# Patient Record
Sex: Female | Born: 1960 | Race: White | Hispanic: No | Marital: Married | Smoking: Never smoker
Health system: Southern US, Community
[De-identification: ages and names within clinical notes are randomized; demographics above are authoritative.]

## PROBLEM LIST (undated history)

## (undated) DIAGNOSIS — J45909 Unspecified asthma, uncomplicated: Secondary | ICD-10-CM

## (undated) DIAGNOSIS — E039 Hypothyroidism, unspecified: Secondary | ICD-10-CM

## (undated) DIAGNOSIS — R41840 Attention and concentration deficit: Secondary | ICD-10-CM

## (undated) DIAGNOSIS — K219 Gastro-esophageal reflux disease without esophagitis: Secondary | ICD-10-CM

## (undated) DIAGNOSIS — M1990 Unspecified osteoarthritis, unspecified site: Secondary | ICD-10-CM

## (undated) DIAGNOSIS — M810 Age-related osteoporosis without current pathological fracture: Secondary | ICD-10-CM

---

## 2010-02-17 ENCOUNTER — Emergency Department (HOSPITAL_COMMUNITY): Admission: EM | Admit: 2010-02-17 | Discharge: 2010-02-17 | Payer: Self-pay | Admitting: Emergency Medicine

## 2011-01-07 ENCOUNTER — Inpatient Hospital Stay (INDEPENDENT_AMBULATORY_CARE_PROVIDER_SITE_OTHER)
Admission: RE | Admit: 2011-01-07 | Discharge: 2011-01-07 | Disposition: A | Discharge status: Home / Self Care | Payer: BC Managed Care – PPO | Source: Ambulatory Visit | Attending: Emergency Medicine | Admitting: Emergency Medicine

## 2011-01-07 DIAGNOSIS — G43009 Migraine without aura, not intractable, without status migrainosus: Secondary | ICD-10-CM

## 2011-01-07 LAB — POCT URINALYSIS DIPSTICK
Bilirubin Urine: NEGATIVE
Ketones, ur: NEGATIVE mg/dL
Nitrite: NEGATIVE
Protein, ur: 30 mg/dL — AB
Specific Gravity, Urine: 1.025 (ref 1.005–1.030)
Urine Glucose, Fasting: NEGATIVE mg/dL
Urobilinogen, UA: 0.2 mg/dL (ref 0.0–1.0)
pH: 5.5 (ref 5.0–8.0)

## 2011-01-07 LAB — CBC
HCT: 38.1 % (ref 36.0–46.0)
Hemoglobin: 12.7 g/dL (ref 12.0–15.0)
MCH: 28.5 pg (ref 26.0–34.0)
MCHC: 33.3 g/dL (ref 30.0–36.0)
MCV: 85.6 fL (ref 78.0–100.0)
Platelets: 286 10*3/uL (ref 150–400)
WBC: 6.6 10*3/uL (ref 4.0–10.5)

## 2011-01-07 LAB — POCT I-STAT, CHEM 8
BUN: 6 mg/dL (ref 6–23)
Creatinine, Ser: 0.5 mg/dL (ref 0.4–1.2)
Glucose, Bld: 85 mg/dL (ref 70–99)
Sodium: 139 mEq/L (ref 135–145)
TCO2: 23 mmol/L (ref 0–100)

## 2011-01-07 LAB — POCT PREGNANCY, URINE: Preg Test, Ur: NEGATIVE

## 2011-01-22 ENCOUNTER — Ambulatory Visit: Payer: BC Managed Care – PPO | Attending: Family Medicine

## 2011-01-22 DIAGNOSIS — M25569 Pain in unspecified knee: Secondary | ICD-10-CM | POA: Insufficient documentation

## 2011-01-22 DIAGNOSIS — IMO0001 Reserved for inherently not codable concepts without codable children: Secondary | ICD-10-CM | POA: Insufficient documentation

## 2011-01-22 DIAGNOSIS — R5381 Other malaise: Secondary | ICD-10-CM | POA: Insufficient documentation

## 2011-01-27 ENCOUNTER — Ambulatory Visit: Payer: BC Managed Care – PPO | Admitting: Physical Therapy

## 2011-01-29 ENCOUNTER — Ambulatory Visit: Payer: BC Managed Care – PPO

## 2011-02-03 ENCOUNTER — Ambulatory Visit: Payer: BC Managed Care – PPO

## 2011-02-05 ENCOUNTER — Ambulatory Visit: Payer: BC Managed Care – PPO | Admitting: Physical Therapy

## 2011-02-10 ENCOUNTER — Ambulatory Visit: Payer: BC Managed Care – PPO | Admitting: Physical Therapy

## 2011-02-12 ENCOUNTER — Ambulatory Visit: Payer: BC Managed Care – PPO | Admitting: Physical Therapy

## 2011-02-24 ENCOUNTER — Ambulatory Visit: Payer: BC Managed Care – PPO | Attending: Family Medicine | Admitting: Physical Therapy

## 2011-02-24 DIAGNOSIS — IMO0001 Reserved for inherently not codable concepts without codable children: Secondary | ICD-10-CM | POA: Insufficient documentation

## 2011-02-24 DIAGNOSIS — M25569 Pain in unspecified knee: Secondary | ICD-10-CM | POA: Insufficient documentation

## 2011-02-24 DIAGNOSIS — R5381 Other malaise: Secondary | ICD-10-CM | POA: Insufficient documentation

## 2011-02-26 ENCOUNTER — Ambulatory Visit: Payer: BC Managed Care – PPO | Admitting: Physical Therapy

## 2012-06-05 ENCOUNTER — Emergency Department (INDEPENDENT_AMBULATORY_CARE_PROVIDER_SITE_OTHER)
Admission: EM | Admit: 2012-06-05 | Discharge: 2012-06-05 | Disposition: A | Discharge status: Home / Self Care | Payer: BC Managed Care – PPO | Source: Home / Self Care | Attending: Emergency Medicine | Admitting: Emergency Medicine

## 2012-06-05 ENCOUNTER — Encounter (HOSPITAL_COMMUNITY): Payer: Self-pay

## 2012-06-05 DIAGNOSIS — Z20818 Contact with and (suspected) exposure to other bacterial communicable diseases: Secondary | ICD-10-CM

## 2012-06-05 DIAGNOSIS — Z2089 Contact with and (suspected) exposure to other communicable diseases: Secondary | ICD-10-CM

## 2012-06-05 HISTORY — DX: Attention and concentration deficit: R41.840

## 2012-06-05 HISTORY — DX: Gastro-esophageal reflux disease without esophagitis: K21.9

## 2012-06-05 HISTORY — DX: Unspecified asthma, uncomplicated: J45.909

## 2012-06-05 MED ORDER — AZITHROMYCIN 250 MG PO TABS: ORAL_TABLET | ORAL | Status: AC

## 2012-06-05 NOTE — ED Notes (Signed)
Pts step daughter was at her house on Monday and Tuesday and dxed with pertussis on Thursday and was advised to seek tx.

## 2012-06-05 NOTE — ED Provider Notes (Signed)
Chief Complaint  Patient presents with  . worry well     History of Present Illness:   Andrea Carrillo is a 51 year old female who has been exposed to pertussis. Her stepdaughter stay with her for 2 nights, this past Monday and Tuesday, a week ago. On Thursday, 4 days ago, she was diagnosed as having pertussis. I'm not certain as to her exact symptoms, because Andrea Carrillo said that she wasn't really coughing that badly. Andrea Carrillo herself has not had any symptoms including nasal congestion, rhinorrhea, sore throat, or watery eyes. She denies any coughing, wheezing, or shortness of breath. She does have a history of asthma. She had a Tdap vaccine in 2008. She was advised by a relative, who is a family physician to seek treatment.  Review of Systems:  Other than noted above, the patient denies any of the following symptoms. Systemic:  No fever, chills, sweats, fatigue, myalgias, headache, or anorexia. Eye:  No redness, pain or drainage. ENT:  No earache, ear congestion, nasal congestion, sneezing, rhinorrhea, sinus pressure, sinus pain, post nasal drip, or sore throat. Lungs:  No cough, sputum production, wheezing, shortness of breath, or chest pain. GI:  No abdominal pain, nausea, vomiting, or diarrhea. Skin:  No rash or itching.  PMFSH:  Past medical history, family history, social history, meds, and allergies were reviewed.  Physical Exam:   Vital signs:  BP 198/72  Pulse 72  Temp 98.5 F (36.9 C) (Oral)  Resp 18  SpO2 98%  LMP 05/22/2012 General:  Alert, in no distress. Eye:  No conjunctival injection or drainage. Lids were normal. ENT:  TMs and canals were normal, without erythema or inflammation.  Nasal mucosa was clear and uncongested, without drainage.  Mucous membranes were moist.  Pharynx was clear, without exudate or drainage.  There were no oral ulcerations or lesions. Neck:  Supple, no adenopathy, tenderness or mass. Lungs:  No respiratory distress.  Lungs were clear to auscultation,  without wheezes, rales or rhonchi.  Breath sounds were clear and equal bilaterally. Lungs were resonant to percussion.  No egophony. Heart:  Regular rhythm, without gallops, murmers or rubs. Skin:  Clear, warm, and dry, without rash or lesions.  Assessment:  The encounter diagnosis was Pertussis exposure. She has a household exposure to a culture confirmed case of pertussis, therefore by guidelines, she should get prophylaxis.  Plan:   1.  The following meds were prescribed:   New Prescriptions   AZITHROMYCIN (ZITHROMAX Z-PAK) 250 MG TABLET    Take as directed.   2.  The patient was instructed to be on the lookout for any respiratory symptoms in the next 3 weeks, and report any symptoms to her primary care physician or to come here.  Reuben Likes, MD 06/05/12 1357

## 2013-02-27 ENCOUNTER — Other Ambulatory Visit: Payer: Self-pay | Admitting: Family Medicine

## 2013-03-08 ENCOUNTER — Other Ambulatory Visit: Payer: BC Managed Care – PPO

## 2013-03-13 ENCOUNTER — Other Ambulatory Visit: Payer: Self-pay | Admitting: Family Medicine

## 2013-03-13 DIAGNOSIS — R928 Other abnormal and inconclusive findings on diagnostic imaging of breast: Secondary | ICD-10-CM

## 2013-03-21 ENCOUNTER — Other Ambulatory Visit: Payer: BC Managed Care – PPO

## 2018-03-08 ENCOUNTER — Other Ambulatory Visit: Payer: Self-pay | Admitting: Family Medicine

## 2018-03-08 DIAGNOSIS — Z79899 Other long term (current) drug therapy: Secondary | ICD-10-CM

## 2018-03-09 ENCOUNTER — Ambulatory Visit
Admission: RE | Admit: 2018-03-09 | Discharge: 2018-03-09 | Disposition: A | Discharge status: Home / Self Care | Payer: BLUE CROSS/BLUE SHIELD | Source: Ambulatory Visit | Attending: Family Medicine | Admitting: Family Medicine

## 2018-03-09 ENCOUNTER — Other Ambulatory Visit: Payer: Self-pay

## 2018-03-09 DIAGNOSIS — Z79899 Other long term (current) drug therapy: Secondary | ICD-10-CM

## 2018-06-29 ENCOUNTER — Observation Stay (HOSPITAL_COMMUNITY)
Admission: EM | Admit: 2018-06-29 | Discharge: 2018-07-01 | Disposition: A | Discharge status: Home / Self Care | Payer: BLUE CROSS/BLUE SHIELD | Attending: Internal Medicine | Admitting: Internal Medicine

## 2018-06-29 ENCOUNTER — Emergency Department (HOSPITAL_COMMUNITY): Payer: BLUE CROSS/BLUE SHIELD

## 2018-06-29 ENCOUNTER — Encounter (HOSPITAL_COMMUNITY): Payer: Self-pay | Admitting: Emergency Medicine

## 2018-06-29 ENCOUNTER — Other Ambulatory Visit: Payer: Self-pay

## 2018-06-29 DIAGNOSIS — S52611A Displaced fracture of right ulna styloid process, initial encounter for closed fracture: Secondary | ICD-10-CM | POA: Insufficient documentation

## 2018-06-29 DIAGNOSIS — M25552 Pain in left hip: Secondary | ICD-10-CM | POA: Diagnosis present

## 2018-06-29 DIAGNOSIS — E039 Hypothyroidism, unspecified: Secondary | ICD-10-CM | POA: Diagnosis not present

## 2018-06-29 DIAGNOSIS — K219 Gastro-esophageal reflux disease without esophagitis: Secondary | ICD-10-CM | POA: Insufficient documentation

## 2018-06-29 DIAGNOSIS — W1830XA Fall on same level, unspecified, initial encounter: Secondary | ICD-10-CM | POA: Insufficient documentation

## 2018-06-29 DIAGNOSIS — C50911 Malignant neoplasm of unspecified site of right female breast: Secondary | ICD-10-CM | POA: Diagnosis not present

## 2018-06-29 DIAGNOSIS — M1990 Unspecified osteoarthritis, unspecified site: Secondary | ICD-10-CM | POA: Insufficient documentation

## 2018-06-29 DIAGNOSIS — S52501A Unspecified fracture of the lower end of right radius, initial encounter for closed fracture: Secondary | ICD-10-CM | POA: Diagnosis not present

## 2018-06-29 DIAGNOSIS — E049 Nontoxic goiter, unspecified: Secondary | ICD-10-CM | POA: Diagnosis not present

## 2018-06-29 DIAGNOSIS — R55 Syncope and collapse: Secondary | ICD-10-CM | POA: Diagnosis not present

## 2018-06-29 DIAGNOSIS — C50919 Malignant neoplasm of unspecified site of unspecified female breast: Secondary | ICD-10-CM | POA: Diagnosis present

## 2018-06-29 DIAGNOSIS — Z853 Personal history of malignant neoplasm of breast: Secondary | ICD-10-CM | POA: Diagnosis not present

## 2018-06-29 DIAGNOSIS — W19XXXA Unspecified fall, initial encounter: Secondary | ICD-10-CM

## 2018-06-29 DIAGNOSIS — Y92511 Restaurant or cafe as the place of occurrence of the external cause: Secondary | ICD-10-CM | POA: Insufficient documentation

## 2018-06-29 DIAGNOSIS — M810 Age-related osteoporosis without current pathological fracture: Secondary | ICD-10-CM | POA: Diagnosis present

## 2018-06-29 DIAGNOSIS — J45909 Unspecified asthma, uncomplicated: Secondary | ICD-10-CM | POA: Diagnosis not present

## 2018-06-29 DIAGNOSIS — S52591A Other fractures of lower end of right radius, initial encounter for closed fracture: Secondary | ICD-10-CM | POA: Diagnosis present

## 2018-06-29 HISTORY — DX: Hypothyroidism, unspecified: E03.9

## 2018-06-29 HISTORY — DX: Unspecified osteoarthritis, unspecified site: M19.90

## 2018-06-29 HISTORY — DX: Age-related osteoporosis without current pathological fracture: M81.0

## 2018-06-29 LAB — CBC WITH DIFFERENTIAL/PLATELET
Abs Immature Granulocytes: 0 10*3/uL (ref 0.0–0.1)
Basophils Absolute: 0 10*3/uL (ref 0.0–0.1)
Basophils Relative: 0 %
EOS PCT: 1 %
Eosinophils Absolute: 0.1 10*3/uL (ref 0.0–0.7)
HEMATOCRIT: 43.4 % (ref 36.0–46.0)
HEMOGLOBIN: 13.6 g/dL (ref 12.0–15.0)
Immature Granulocytes: 1 %
LYMPHS ABS: 1.4 10*3/uL (ref 0.7–4.0)
LYMPHS PCT: 19 %
MCH: 28.6 pg (ref 26.0–34.0)
MCHC: 31.3 g/dL (ref 30.0–36.0)
MCV: 91.2 fL (ref 78.0–100.0)
MONO ABS: 0.4 10*3/uL (ref 0.1–1.0)
MONOS PCT: 5 %
Neutro Abs: 5.4 10*3/uL (ref 1.7–7.7)
Neutrophils Relative %: 74 %
Platelets: 215 10*3/uL (ref 150–400)
RBC: 4.76 MIL/uL (ref 3.87–5.11)
RDW: 13.5 % (ref 11.5–15.5)
WBC: 7.4 10*3/uL (ref 4.0–10.5)

## 2018-06-29 LAB — COMPREHENSIVE METABOLIC PANEL
ALK PHOS: 67 U/L (ref 38–126)
ALT: 15 U/L (ref 0–44)
ANION GAP: 10 (ref 5–15)
AST: 21 U/L (ref 15–41)
Albumin: 3.7 g/dL (ref 3.5–5.0)
BILIRUBIN TOTAL: 0.7 mg/dL (ref 0.3–1.2)
BUN: 15 mg/dL (ref 6–20)
CALCIUM: 9.2 mg/dL (ref 8.9–10.3)
CO2: 25 mmol/L (ref 22–32)
Chloride: 106 mmol/L (ref 98–111)
Creatinine, Ser: 0.69 mg/dL (ref 0.44–1.00)
GFR calc Af Amer: >60 mL/min (ref >60)
GFR calc non Af Amer: >60 mL/min (ref >60)
Glucose, Bld: 153 mg/dL — ABNORMAL HIGH (ref 70–99)
Potassium: 4.4 mmol/L (ref 3.5–5.1)
Sodium: 141 mmol/L (ref 135–145)
TOTAL PROTEIN: 6.5 g/dL (ref 6.5–8.1)

## 2018-06-29 LAB — I-STAT TROPONIN, ED: Troponin i, poc: 0 ng/mL (ref 0.00–0.08)

## 2018-06-29 LAB — I-STAT BETA HCG BLOOD, ED (MC, WL, AP ONLY)

## 2018-06-29 LAB — TROPONIN I: Troponin I: <0.03 ng/mL (ref <0.03)

## 2018-06-29 LAB — T4, FREE: Free T4: 0.67 ng/dL — ABNORMAL LOW (ref 0.82–1.77)

## 2018-06-29 LAB — TSH: TSH: 1.116 u[IU]/mL (ref 0.350–4.500)

## 2018-06-29 MED ORDER — ONDANSETRON HCL 4 MG/2ML IJ SOLN
4.0000 mg | Freq: Once | INTRAMUSCULAR | Status: AC
  Administered 2018-06-29: 4 mg via INTRAVENOUS
  Filled 2018-06-29: qty 2

## 2018-06-29 MED ORDER — ONDANSETRON HCL 4 MG PO TABS: 4.0000 mg | ORAL_TABLET | Freq: Four times a day (QID) | ORAL | Status: DC | PRN

## 2018-06-29 MED ORDER — IOPAMIDOL (ISOVUE-370) INJECTION 76%
INTRAVENOUS | Status: AC
  Filled 2018-06-29: qty 100

## 2018-06-29 MED ORDER — HYDROMORPHONE HCL 1 MG/ML IJ SOLN
1.0000 mg | Freq: Once | INTRAMUSCULAR | Status: AC
  Administered 2018-06-29: 1 mg via INTRAVENOUS
  Filled 2018-06-29: qty 1

## 2018-06-29 MED ORDER — OXYCODONE HCL 5 MG PO TABS
5.0000 mg | ORAL_TABLET | ORAL | Status: DC | PRN
  Administered 2018-06-30 – 2018-07-01 (×7): 5 mg via ORAL
  Filled 2018-06-29 (×7): qty 1

## 2018-06-29 MED ORDER — IOPAMIDOL (ISOVUE-370) INJECTION 76%
100.0000 mL | Freq: Once | INTRAVENOUS | Status: AC | PRN
  Administered 2018-06-29: 100 mL via INTRAVENOUS

## 2018-06-29 MED ORDER — SODIUM CHLORIDE 0.9% FLUSH
3.0000 mL | Freq: Two times a day (BID) | INTRAVENOUS | Status: DC
  Administered 2018-06-30: 3 mL via INTRAVENOUS

## 2018-06-29 MED ORDER — ACETAMINOPHEN 650 MG RE SUPP: 650.0000 mg | Freq: Four times a day (QID) | RECTAL | Status: DC | PRN

## 2018-06-29 MED ORDER — ENOXAPARIN SODIUM 40 MG/0.4ML ~~LOC~~ SOLN
40.0000 mg | SUBCUTANEOUS | Status: DC
  Administered 2018-06-30 – 2018-07-01 (×2): 40 mg via SUBCUTANEOUS
  Filled 2018-06-29 (×2): qty 0.4

## 2018-06-29 MED ORDER — POLYETHYLENE GLYCOL 3350 17 G PO PACK: 17.0000 g | PACK | Freq: Every day | ORAL | Status: DC | PRN

## 2018-06-29 MED ORDER — SODIUM CHLORIDE 0.9 % IV SOLN
100.0000 mL/h | INTRAVENOUS | Status: DC
  Administered 2018-06-30: 100 mL/h via INTRAVENOUS

## 2018-06-29 MED ORDER — SODIUM CHLORIDE 0.9 % IV BOLUS
1000.0000 mL | Freq: Once | INTRAVENOUS | Status: AC
  Administered 2018-06-29: 1000 mL via INTRAVENOUS

## 2018-06-29 MED ORDER — PROMETHAZINE HCL 25 MG/ML IJ SOLN
25.0000 mg | Freq: Once | INTRAMUSCULAR | Status: AC
  Administered 2018-06-29: 25 mg via INTRAVENOUS
  Filled 2018-06-29: qty 1

## 2018-06-29 MED ORDER — ONDANSETRON HCL 4 MG/2ML IJ SOLN
4.0000 mg | Freq: Four times a day (QID) | INTRAMUSCULAR | Status: DC | PRN
  Administered 2018-06-30: 4 mg via INTRAVENOUS
  Filled 2018-06-29: qty 2

## 2018-06-29 MED ORDER — MORPHINE SULFATE (PF) 2 MG/ML IV SOLN
2.0000 mg | INTRAVENOUS | Status: DC | PRN
  Administered 2018-06-30 (×3): 2 mg via INTRAVENOUS
  Filled 2018-06-29 (×3): qty 1

## 2018-06-29 MED ORDER — ACETAMINOPHEN 325 MG PO TABS
650.0000 mg | ORAL_TABLET | Freq: Four times a day (QID) | ORAL | Status: DC | PRN
  Administered 2018-06-30 – 2018-07-01 (×3): 650 mg via ORAL
  Filled 2018-06-29 (×3): qty 2

## 2018-06-29 NOTE — Progress Notes (Signed)
Orthopedic Tech Progress Note Patient Details:  Andrea Carrillo 1961-10-04 [id number]  Ortho Devices Type of Ortho Device: Velcro wrist splint, Sling immobilizer Ortho Device/Splint Location: rue Ortho Device/Splint Interventions: Ordered, Application, Adjustment   Post Interventions Patient Tolerated: Well Instructions Provided: Care of device, Adjustment of device Applied velcro wrist at drs request.  Karolee Stamps 06/29/2018, 9:33 PM

## 2018-06-29 NOTE — ED Provider Notes (Signed)
[facility] EMERGENCY DEPARTMENT Provider Note   CSN: [id number] Arrival date & time: 06/29/18  1740     History   Chief Complaint Chief Complaint  Patient presents with  . Loss of Consciousness    HPI Carnita Rouillard is a 57 y.o. female hx of osteoporosis, previous breast cancer in remission, here presenting with possible syncope, fall.  Patient states that she was at [employer] and was bending down and did not quite remember what happened.  She states that she may have passed out and woke up on the floor with severe right wrist pain.  She also had some left-sided rib pain and abdominal pain as well.  EMS noticed that she was in severe pain she was given fentanyl 100 mcg prior to arrival.  She also was noted to have right wrist swelling and was put in a splint and a towel was placed for c-collar.   The history is provided by the patient.    Past Medical History:  Diagnosis Date  . Acid reflux   . Arthritis   . Asthma   . Attention deficit   . Osteoporosis     There are no active problems to display for this patient.   History reviewed. No pertinent surgical history.   OB History   None      Home Medications    Prior to Admission medications   Medication Sig Start Date End Date Taking? Authorizing Provider  letrozole (FEMARA) 2.5 MG tablet Take 2.5 mg by mouth every evening.  11/04/17  Yes [provider]  levothyroxine (SYNTHROID, LEVOTHROID) 25 MCG tablet Take 25 mcg by mouth every evening.    Yes [provider]  omeprazole (PRILOSEC) 20 MG capsule Take 20 mg by mouth every evening. 05/03/14  Yes [provider]    Family History No family history on file.  Social History Social History   Tobacco Use  . Smoking status: Never Smoker  Substance Use Topics  . Alcohol use: No  . Drug use: No     Allergies   Latex   Review of Systems Review of Systems  Cardiovascular: Positive for syncope.    Gastrointestinal: Positive for abdominal pain.  Musculoskeletal:       R wrist pain, L rib pain   All other systems reviewed and are negative.    Physical Exam Updated Vital Signs BP 127/80   Pulse (!) 57   Temp 97.9 F (36.6 C) (Oral)   Resp 16   LMP 06/21/2018 Comment: Hysterectomy  SpO2 95%   Physical Exam  Constitutional: She is oriented to person, place, and time.  Uncomfortable   HENT:  Head: Normocephalic.  ? Bruising R frontal area   Eyes: Pupils are equal, round, and reactive to light. Conjunctivae and EOM are normal.  Neck: Normal range of motion. Neck supple.  Cardiovascular: Normal rate, regular rhythm and normal heart sounds.  Pulmonary/Chest: Effort normal and breath sounds normal. No stridor. No respiratory distress.  + L lower rib tenderness   Abdominal: Soft. Bowel sounds are normal.  Mild tenderness LUQ   Musculoskeletal:  Obvious deformity R distal forearm. No humerus or shoulder tenderness or deformity. No midline spinal tenderness, nl ROM bilateral hips   Neurological: She is alert and oriented to person, place, and time. No cranial nerve deficit. Coordination normal.  Skin: Skin is warm.  Psychiatric: She has a normal mood and affect.  Nursing note and vitals reviewed.    ED Treatments /  Results  Labs (all labs ordered are listed, but only abnormal results are displayed) Labs Reviewed  COMPREHENSIVE METABOLIC PANEL - Abnormal; Notable for the following components:      Result Value   Glucose, Bld 153 (*)    All other components within normal limits  CBC WITH DIFFERENTIAL/PLATELET  I-STAT TROPONIN, ED  I-STAT BETA HCG BLOOD, ED ([facility], [facility], [facility] ONLY)    EKG EKG Interpretation  Date/Time:  Wednesday June 29 2018 17:58:11 EDT Ventricular Rate:  51 PR Interval:    QRS Duration: 101 QT Interval:  440 QTC Calculation: 406 R Axis:   48 Text Interpretation:  Sinus rhythm Abnormal R-wave progression, early transition Borderline ST elevation,  lateral leads No previous ECGs available Confirmed by Wandra Arthurs ([id number]) on 06/29/2018 6:12:41 PM   Radiology Dg Chest 2 View  Result Date: 06/29/2018 CLINICAL DATA:  Syncopal episode resulting in a fall today. EXAM: CHEST - 2 VIEW COMPARISON:  None. FINDINGS: Normal sized heart. Clear lungs. Mild central peribronchial thickening. Bilateral axillary surgical clips. No fracture or pneumothorax. Normal vascularity. IMPRESSION: No acute abnormality.  Mild chronic bronchitic changes. Electronically Signed   By: Claudie Revering M.D.   On: 06/29/2018 19:47   Dg Forearm Right  Result Date: 06/29/2018 CLINICAL DATA:  Distal right forearm and wrist pain following a fall today. EXAM: RIGHT FOREARM - 2 VIEW COMPARISON:  None. FINDINGS: Mildly comminuted, impacted fracture of the distal radial metaphysis with a suggestion of mild dorsal displacement and angulation of the distal fragment on an oblique lateral view. There is no true lateral view available for accurate assessment. Also demonstrated is a nondisplaced fracture through the base of the ulnar styloid. IMPRESSION: Distal radius and ulnar styloid fractures, as described above. A true lateral view of the wrist would be necessary to accurately assess alignment. Electronically Signed   By: Claudie Revering M.D.   On: 06/29/2018 19:45   Ct Head Wo Contrast  Result Date: 06/29/2018 CLINICAL DATA:  57 year old female with a history of syncope EXAM: CT HEAD WITHOUT CONTRAST CT CERVICAL SPINE WITHOUT CONTRAST TECHNIQUE: Multidetector CT imaging of the head and cervical spine was performed following the standard protocol without intravenous contrast. Multiplanar CT image reconstructions of the cervical spine were also generated. COMPARISON:  None. FINDINGS: CT HEAD FINDINGS Brain: No acute intracranial hemorrhage. No midline shift or mass effect. Gray-white differentiation maintained. Unremarkable appearance of the ventricular system. Vascular: Unremarkable. Skull: No acute  fracture.  No aggressive bone lesion identified. Sinuses/Orbits: Unremarkable appearance of the orbits. Mastoid air cells clear. No middle ear effusion. No significant sinus disease. Other: None CT CERVICAL SPINE FINDINGS Alignment: Craniocervical junction aligned. Anatomic alignment of the cervical elements, with straightening of the normal cervical lordosis, likely positional. No subluxation. Skull base and vertebrae: No acute fracture at the skullbase. Vertebral body heights relatively maintained. No acute fracture identified. Soft tissues and spinal canal: Unremarkable cervical soft tissues. Lymph nodes are present, though not enlarged. Disc levels: Disc space narrowing with early endplate changes. No significant canal narrowing or significant foraminal narrowing. Upper chest: Unremarkable appearance of the lung apices. Enlargement of the left thyroid. Other: No bony canal narrowing. IMPRESSION: Head CT: Negative for acute intracranial abnormality. Cervical CT: No acute fracture or malalignment of the cervical spine. Left thyroid goiter.  Endocrine evaluation may be warranted. Electronically Signed   By: Corrie Mckusick D.O.   On: 06/29/2018 20:17   Ct Angio Chest Pe W And/or Wo Contrast  Result Date: 06/29/2018 CLINICAL  DATA:  57 year old female with a history of syncope. Left-sided chest pain EXAM: CT ANGIOGRAPHY CHEST CT ABDOMEN AND PELVIS WITH CONTRAST TECHNIQUE: Multidetector CT imaging of the chest was performed using the standard protocol during bolus administration of intravenous contrast. Multiplanar CT image reconstructions and MIPs were obtained to evaluate the vascular anatomy. Multidetector CT imaging of the abdomen and pelvis was performed using the standard protocol during bolus administration of intravenous contrast. CONTRAST:  147mL ISOVUE-370 IOPAMIDOL (ISOVUE-370) INJECTION 76% COMPARISON:  None. FINDINGS: CTA CHEST FINDINGS Cardiovascular: Heart: No cardiomegaly. No pericardial  fluid/thickening. No significant coronary calcifications. Aorta: Unremarkable course, caliber, contour of the thoracic aorta. No aneurysm or dissection flap. No periaortic fluid. Pulmonary arteries: No central, lobar, segmental, or proximal subsegmental filling defects. Mediastinum/Nodes: Rightward deviation of the trachea secondary to enlargement of the left thyroid. Greatest diameter of the left thyroid tissue measures approximately 4.4 cm on the axial images. Unremarkable course of the thoracic esophagus. No lymphadenopathy. Lungs/Pleura: Central airways are clear. No pleural effusion. No confluent airspace disease. No pneumothorax. Musculoskeletal: No acute displaced fracture. Degenerative changes of the thoracic spine. Review of the MIP images confirms the above findings. CT ABDOMEN and PELVIS FINDINGS Hepatobiliary: There are a few small hypodense lesions of liver parenchyma. The largest measures 17 mm in segment 6. Overall, features are most compatible with benign cystic lesions. Calcified cholelithiasis without associated inflammation. Pancreas: Unremarkable pancreas Spleen: Unremarkable spleen Adrenals/Urinary Tract: Unremarkable appearance of the adrenal glands. No evidence of hydronephrosis of the right or left kidney. No nephrolithiasis. Unremarkable course of the bilateral ureters. Unremarkable appearance of the urinary bladder. Stomach/Bowel: Surgical changes of the stomach. Small bowel unremarkable with no abnormal distention. No transition point. Laxity of the ventral abdominal wall, with focal hernia on the patient's right lower abdominal wall containing small bowel loops. No transition point. There is a smaller hernia sac in the supraumbilical region containing transverse colon with no transition point. Normal appendix. Colonic diverticula without associated inflammation. Vascular/Lymphatic: No significant atherosclerosis. Mesenteric, renal, bilateral lower extremity arteries are patent. No  abdominal lymphadenopathy. No retroperitoneal or pelvic lymphadenopathy. Reproductive: Hysterectomy Other: Additional hernia of the left lower abdominal wall containing fat. Musculoskeletal: No displaced fracture. Degenerative changes of the thoracolumbar spine. Degenerative changes of the hips. Review of the MIP images confirms the above findings. IMPRESSION: No acute CT finding of the chest, abdomen, pelvis. Left thyroid goiter.  Endocrine evaluation may be considered. Cholelithiasis without evidence of acute cholecystitis. Low-density, nonenhancing lesions of the liver, most likely benign biliary cysts. Surgical changes of the stomach. There are 3 ventral wall hernias. Supraumbilical hernia contains transverse colon without obstruction. Right lower abdominal wall hernia contains small bowel loops, without obstruction. Left lower abdominal hernia contains mesenteric fat. Diverticular disease without evidence of acute diverticulitis. Electronically Signed   By: Corrie Mckusick D.O.   On: 06/29/2018 20:28   Ct Cervical Spine Wo Contrast  Result Date: 06/29/2018 CLINICAL DATA:  57 year old female with a history of syncope EXAM: CT HEAD WITHOUT CONTRAST CT CERVICAL SPINE WITHOUT CONTRAST TECHNIQUE: Multidetector CT imaging of the head and cervical spine was performed following the standard protocol without intravenous contrast. Multiplanar CT image reconstructions of the cervical spine were also generated. COMPARISON:  None. FINDINGS: CT HEAD FINDINGS Brain: No acute intracranial hemorrhage. No midline shift or mass effect. Gray-white differentiation maintained. Unremarkable appearance of the ventricular system. Vascular: Unremarkable. Skull: No acute fracture.  No aggressive bone lesion identified. Sinuses/Orbits: Unremarkable appearance of the orbits. Mastoid air cells clear. No  middle ear effusion. No significant sinus disease. Other: None CT CERVICAL SPINE FINDINGS Alignment: Craniocervical junction aligned.  Anatomic alignment of the cervical elements, with straightening of the normal cervical lordosis, likely positional. No subluxation. Skull base and vertebrae: No acute fracture at the skullbase. Vertebral body heights relatively maintained. No acute fracture identified. Soft tissues and spinal canal: Unremarkable cervical soft tissues. Lymph nodes are present, though not enlarged. Disc levels: Disc space narrowing with early endplate changes. No significant canal narrowing or significant foraminal narrowing. Upper chest: Unremarkable appearance of the lung apices. Enlargement of the left thyroid. Other: No bony canal narrowing. IMPRESSION: Head CT: Negative for acute intracranial abnormality. Cervical CT: No acute fracture or malalignment of the cervical spine. Left thyroid goiter.  Endocrine evaluation may be warranted. Electronically Signed   By: Corrie Mckusick D.O.   On: 06/29/2018 20:17   Ct Abdomen Pelvis W Contrast  Result Date: 06/29/2018 CLINICAL DATA:  57 year old female with a history of syncope. Left-sided chest pain EXAM: CT ANGIOGRAPHY CHEST CT ABDOMEN AND PELVIS WITH CONTRAST TECHNIQUE: Multidetector CT imaging of the chest was performed using the standard protocol during bolus administration of intravenous contrast. Multiplanar CT image reconstructions and MIPs were obtained to evaluate the vascular anatomy. Multidetector CT imaging of the abdomen and pelvis was performed using the standard protocol during bolus administration of intravenous contrast. CONTRAST:  138mL ISOVUE-370 IOPAMIDOL (ISOVUE-370) INJECTION 76% COMPARISON:  None. FINDINGS: CTA CHEST FINDINGS Cardiovascular: Heart: No cardiomegaly. No pericardial fluid/thickening. No significant coronary calcifications. Aorta: Unremarkable course, caliber, contour of the thoracic aorta. No aneurysm or dissection flap. No periaortic fluid. Pulmonary arteries: No central, lobar, segmental, or proximal subsegmental filling defects. Mediastinum/Nodes:  Rightward deviation of the trachea secondary to enlargement of the left thyroid. Greatest diameter of the left thyroid tissue measures approximately 4.4 cm on the axial images. Unremarkable course of the thoracic esophagus. No lymphadenopathy. Lungs/Pleura: Central airways are clear. No pleural effusion. No confluent airspace disease. No pneumothorax. Musculoskeletal: No acute displaced fracture. Degenerative changes of the thoracic spine. Review of the MIP images confirms the above findings. CT ABDOMEN and PELVIS FINDINGS Hepatobiliary: There are a few small hypodense lesions of liver parenchyma. The largest measures 17 mm in segment 6. Overall, features are most compatible with benign cystic lesions. Calcified cholelithiasis without associated inflammation. Pancreas: Unremarkable pancreas Spleen: Unremarkable spleen Adrenals/Urinary Tract: Unremarkable appearance of the adrenal glands. No evidence of hydronephrosis of the right or left kidney. No nephrolithiasis. Unremarkable course of the bilateral ureters. Unremarkable appearance of the urinary bladder. Stomach/Bowel: Surgical changes of the stomach. Small bowel unremarkable with no abnormal distention. No transition point. Laxity of the ventral abdominal wall, with focal hernia on the patient's right lower abdominal wall containing small bowel loops. No transition point. There is a smaller hernia sac in the supraumbilical region containing transverse colon with no transition point. Normal appendix. Colonic diverticula without associated inflammation. Vascular/Lymphatic: No significant atherosclerosis. Mesenteric, renal, bilateral lower extremity arteries are patent. No abdominal lymphadenopathy. No retroperitoneal or pelvic lymphadenopathy. Reproductive: Hysterectomy Other: Additional hernia of the left lower abdominal wall containing fat. Musculoskeletal: No displaced fracture. Degenerative changes of the thoracolumbar spine. Degenerative changes of the hips.  Review of the MIP images confirms the above findings. IMPRESSION: No acute CT finding of the chest, abdomen, pelvis. Left thyroid goiter.  Endocrine evaluation may be considered. Cholelithiasis without evidence of acute cholecystitis. Low-density, nonenhancing lesions of the liver, most likely benign biliary cysts. Surgical changes of the stomach. There are 3 ventral  wall hernias. Supraumbilical hernia contains transverse colon without obstruction. Right lower abdominal wall hernia contains small bowel loops, without obstruction. Left lower abdominal hernia contains mesenteric fat. Diverticular disease without evidence of acute diverticulitis. Electronically Signed   By: Corrie Mckusick D.O.   On: 06/29/2018 20:28    Procedures Procedures (including critical care time)  Medications Ordered in ED Medications  iopamidol (ISOVUE-370) 76 % injection (has no administration in time range)  sodium chloride 0.9 % bolus 1,000 mL (0 mLs Intravenous Stopped 06/29/18 1916)  HYDROmorphone (DILAUDID) injection 1 mg (1 mg Intravenous Given 06/29/18 1808)  ondansetron (ZOFRAN) injection 4 mg (4 mg Intravenous Given 06/29/18 1807)  HYDROmorphone (DILAUDID) injection 1 mg (1 mg Intravenous Given 06/29/18 1932)  ondansetron (ZOFRAN) injection 4 mg (4 mg Intravenous Given 06/29/18 1932)  iopamidol (ISOVUE-370) 76 % injection 100 mL (100 mLs Intravenous Contrast Given 06/29/18 1945)  promethazine (PHENERGAN) injection 25 mg (25 mg Intravenous Given 06/29/18 2113)     Initial Impression / Assessment and Plan / ED Course  I have reviewed the triage vital signs and the nursing notes.  Pertinent labs & imaging results that were available during my care of the patient were reviewed by me and considered in my medical decision making (see chart for details).     Cecil Shisler is a 57 y.o. female here with syncope. She is put on oxygen at the scene. Has hx of breast cancer so at risk for PE. Has obvious R distal forearm deformity.  Will get CTA chest, CT ab/pel, labs, R forearm xray. Will give pain meds and likely need admission for syncope.   9:42 PM Patient occasionally get bradycardic to 40s. Xray showed distal radius and ulna fractures that is minimally displaced. She has lymphedema at baseline. I called Dr. Caralyn Guile from hand surgery and he recommend velcro wrist splint. He will see patient outpatient. Hospitalist to admit for syncope.     Final Clinical Impressions(s) / ED Diagnoses   Final diagnoses:  None    ED Discharge Orders    None       Drenda Freeze, MD 06/29/18 2147

## 2018-06-29 NOTE — ED Notes (Signed)
ED TO INPATIENT HANDOFF REPORT  Name/Age/Gender Andrea Carrillo 57 y.o. female  Code Status   Home/SNF/Other Home  Chief Complaint syncope  Level of Care/Admitting Diagnosis ED Disposition    ED Disposition Condition [facility]: [place] [[id number]]  Level of Care: Telemetry [5]  Diagnosis: Syncope [765465]  Admitting Physician: Toy Baker [[id number]]  Attending Physician: Toy Baker [[id number]]  PT Class (Do Not Modify): Observation [104]  PT Acc Code (Do Not Modify): Observation [10022]       Medical History Past Medical History:  Diagnosis Date  . Acid reflux   . Arthritis   . Asthma   . Attention deficit   . Osteoporosis     Allergies Allergies  Allergen Reactions  . Latex Hives and Itching    IV Location/Drains/Wounds Patient Lines/Drains/Airways Status   Active Line/Drains/Airways    Name:   Placement date:   Placement time:   Site:   Days:   Peripheral IV 06/29/18 Left Antecubital   06/29/18    1936    Antecubital   less than 1          Labs/Imaging Results for orders placed or performed during the hospital encounter of 06/29/18 (from the past 48 hour(s))  CBC with Differential/Platelet     Status: None   Collection Time: 06/29/18  6:02 PM  Result Value Ref Range   WBC 7.4 4.0 - 10.5 K/uL   RBC 4.76 3.87 - 5.11 MIL/uL   Hemoglobin 13.6 12.0 - 15.0 g/dL   HCT 43.4 36.0 - 46.0 %   MCV 91.2 78.0 - 100.0 fL   MCH 28.6 26.0 - 34.0 pg   MCHC 31.3 30.0 - 36.0 g/dL   RDW 13.5 11.5 - 15.5 %   Platelets 215 150 - 400 K/uL   Neutrophils Relative % 74 %   Neutro Abs 5.4 1.7 - 7.7 K/uL   Lymphocytes Relative 19 %   Lymphs Abs 1.4 0.7 - 4.0 K/uL   Monocytes Relative 5 %   Monocytes Absolute 0.4 0.1 - 1.0 K/uL   Eosinophils Relative 1 %   Eosinophils Absolute 0.1 0.0 - 0.7 K/uL   Basophils Relative 0 %   Basophils Absolute 0.0 0.0 - 0.1 K/uL   Immature Granulocytes 1 %   Abs Immature Granulocytes 0.0  0.0 - 0.1 K/uL    Comment: Performed at [facility], [address] [place], [place] [address]  Comprehensive metabolic panel     Status: Abnormal   Collection Time: 06/29/18  6:02 PM  Result Value Ref Range   Sodium 141 135 - 145 mmol/L   Potassium 4.4 3.5 - 5.1 mmol/L    Comment: HEMOLYSIS AT THIS LEVEL MAY AFFECT RESULT   Chloride 106 98 - 111 mmol/L   CO2 25 22 - 32 mmol/L   Glucose, Bld 153 (H) 70 - 99 mg/dL   BUN 15 6 - 20 mg/dL   Creatinine, Ser 0.69 0.44 - 1.00 mg/dL   Calcium 9.2 8.9 - 10.3 mg/dL   Total Protein 6.5 6.5 - 8.1 g/dL   Albumin 3.7 3.5 - 5.0 g/dL   AST 21 15 - 41 U/L   ALT 15 0 - 44 U/L   Alkaline Phosphatase 67 38 - 126 U/L   Total Bilirubin 0.7 0.3 - 1.2 mg/dL   GFR calc non Af Amer >60 >60 mL/min   GFR calc Af Amer >60 >60 mL/min    Comment: (NOTE) The eGFR  has been calculated using the CKD EPI equation. This calculation has not been validated in all clinical situations. eGFR's persistently <60 mL/min signify possible Chronic Kidney Disease.    Anion gap 10 5 - 15    Comment: Performed at [facility] [address] [place], [place] [address]  I-stat troponin, ED     Status: None   Collection Time: 06/29/18  6:16 PM  Result Value Ref Range   Troponin i, poc 0.00 0.00 - 0.08 ng/mL   Comment 3            Comment: Due to the release kinetics of cTnI, a negative result within the first hours of the onset of symptoms does not rule out myocardial infarction with certainty. If myocardial infarction is still suspected, repeat the test at appropriate intervals.   I-Stat Beta hCG blood, ED ([facility], [facility], [facility] only)     Status: None   Collection Time: 06/29/18  6:17 PM  Result Value Ref Range   I-stat hCG, quantitative <5.0 <5 mIU/mL   Comment 3            Comment:   GEST. AGE      CONC.  (mIU/mL)   <=1 WEEK        5 - 50     2 WEEKS       50 - 500     3 WEEKS       100 - 10,000     4 WEEKS     1,000 - 30,000        FEMALE AND NON-PREGNANT  FEMALE:     LESS THAN 5 mIU/mL    Dg Chest 2 View  Result Date: 06/29/2018 CLINICAL DATA:  Syncopal episode resulting in a fall today. EXAM: CHEST - 2 VIEW COMPARISON:  None. FINDINGS: Normal sized heart. Clear lungs. Mild central peribronchial thickening. Bilateral axillary surgical clips. No fracture or pneumothorax. Normal vascularity. IMPRESSION: No acute abnormality.  Mild chronic bronchitic changes. Electronically Signed   By: Claudie Revering M.D.   On: 06/29/2018 19:47   Dg Forearm Right  Result Date: 06/29/2018 CLINICAL DATA:  Distal right forearm and wrist pain following a fall today. EXAM: RIGHT FOREARM - 2 VIEW COMPARISON:  None. FINDINGS: Mildly comminuted, impacted fracture of the distal radial metaphysis with a suggestion of mild dorsal displacement and angulation of the distal fragment on an oblique lateral view. There is no true lateral view available for accurate assessment. Also demonstrated is a nondisplaced fracture through the base of the ulnar styloid. IMPRESSION: Distal radius and ulnar styloid fractures, as described above. A true lateral view of the wrist would be necessary to accurately assess alignment. Electronically Signed   By: Claudie Revering M.D.   On: 06/29/2018 19:45   Ct Head Wo Contrast  Result Date: 06/29/2018 CLINICAL DATA:  57 year old female with a history of syncope EXAM: CT HEAD WITHOUT CONTRAST CT CERVICAL SPINE WITHOUT CONTRAST TECHNIQUE: Multidetector CT imaging of the head and cervical spine was performed following the standard protocol without intravenous contrast. Multiplanar CT image reconstructions of the cervical spine were also generated. COMPARISON:  None. FINDINGS: CT HEAD FINDINGS Brain: No acute intracranial hemorrhage. No midline shift or mass effect. Gray-white differentiation maintained. Unremarkable appearance of the ventricular system. Vascular: Unremarkable. Skull: No acute fracture.  No aggressive bone lesion identified. Sinuses/Orbits: Unremarkable  appearance of the orbits. Mastoid air cells clear. No middle ear effusion. No significant sinus disease. Other: None CT CERVICAL SPINE FINDINGS  Alignment: Craniocervical junction aligned. Anatomic alignment of the cervical elements, with straightening of the normal cervical lordosis, likely positional. No subluxation. Skull base and vertebrae: No acute fracture at the skullbase. Vertebral body heights relatively maintained. No acute fracture identified. Soft tissues and spinal canal: Unremarkable cervical soft tissues. Lymph nodes are present, though not enlarged. Disc levels: Disc space narrowing with early endplate changes. No significant canal narrowing or significant foraminal narrowing. Upper chest: Unremarkable appearance of the lung apices. Enlargement of the left thyroid. Other: No bony canal narrowing. IMPRESSION: Head CT: Negative for acute intracranial abnormality. Cervical CT: No acute fracture or malalignment of the cervical spine. Left thyroid goiter.  Endocrine evaluation may be warranted. Electronically Signed   By: Corrie Mckusick D.O.   On: 06/29/2018 20:17   Ct Angio Chest Pe W And/or Wo Contrast  Result Date: 06/29/2018 CLINICAL DATA:  57 year old female with a history of syncope. Left-sided chest pain EXAM: CT ANGIOGRAPHY CHEST CT ABDOMEN AND PELVIS WITH CONTRAST TECHNIQUE: Multidetector CT imaging of the chest was performed using the standard protocol during bolus administration of intravenous contrast. Multiplanar CT image reconstructions and MIPs were obtained to evaluate the vascular anatomy. Multidetector CT imaging of the abdomen and pelvis was performed using the standard protocol during bolus administration of intravenous contrast. CONTRAST:  112m ISOVUE-370 IOPAMIDOL (ISOVUE-370) INJECTION 76% COMPARISON:  None. FINDINGS: CTA CHEST FINDINGS Cardiovascular: Heart: No cardiomegaly. No pericardial fluid/thickening. No significant coronary calcifications. Aorta: Unremarkable course,  caliber, contour of the thoracic aorta. No aneurysm or dissection flap. No periaortic fluid. Pulmonary arteries: No central, lobar, segmental, or proximal subsegmental filling defects. Mediastinum/Nodes: Rightward deviation of the trachea secondary to enlargement of the left thyroid. Greatest diameter of the left thyroid tissue measures approximately 4.4 cm on the axial images. Unremarkable course of the thoracic esophagus. No lymphadenopathy. Lungs/Pleura: Central airways are clear. No pleural effusion. No confluent airspace disease. No pneumothorax. Musculoskeletal: No acute displaced fracture. Degenerative changes of the thoracic spine. Review of the MIP images confirms the above findings. CT ABDOMEN and PELVIS FINDINGS Hepatobiliary: There are a few small hypodense lesions of liver parenchyma. The largest measures 17 mm in segment 6. Overall, features are most compatible with benign cystic lesions. Calcified cholelithiasis without associated inflammation. Pancreas: Unremarkable pancreas Spleen: Unremarkable spleen Adrenals/Urinary Tract: Unremarkable appearance of the adrenal glands. No evidence of hydronephrosis of the right or left kidney. No nephrolithiasis. Unremarkable course of the bilateral ureters. Unremarkable appearance of the urinary bladder. Stomach/Bowel: Surgical changes of the stomach. Small bowel unremarkable with no abnormal distention. No transition point. Laxity of the ventral abdominal wall, with focal hernia on the patient's right lower abdominal wall containing small bowel loops. No transition point. There is a smaller hernia sac in the supraumbilical region containing transverse colon with no transition point. Normal appendix. Colonic diverticula without associated inflammation. Vascular/Lymphatic: No significant atherosclerosis. Mesenteric, renal, bilateral lower extremity arteries are patent. No abdominal lymphadenopathy. No retroperitoneal or pelvic lymphadenopathy. Reproductive:  Hysterectomy Other: Additional hernia of the left lower abdominal wall containing fat. Musculoskeletal: No displaced fracture. Degenerative changes of the thoracolumbar spine. Degenerative changes of the hips. Review of the MIP images confirms the above findings. IMPRESSION: No acute CT finding of the chest, abdomen, pelvis. Left thyroid goiter.  Endocrine evaluation may be considered. Cholelithiasis without evidence of acute cholecystitis. Low-density, nonenhancing lesions of the liver, most likely benign biliary cysts. Surgical changes of the stomach. There are 3 ventral wall hernias. Supraumbilical hernia contains transverse colon without obstruction. Right lower  abdominal wall hernia contains small bowel loops, without obstruction. Left lower abdominal hernia contains mesenteric fat. Diverticular disease without evidence of acute diverticulitis. Electronically Signed   By: Corrie Mckusick D.O.   On: 06/29/2018 20:28   Ct Cervical Spine Wo Contrast  Result Date: 06/29/2018 CLINICAL DATA:  57 year old female with a history of syncope EXAM: CT HEAD WITHOUT CONTRAST CT CERVICAL SPINE WITHOUT CONTRAST TECHNIQUE: Multidetector CT imaging of the head and cervical spine was performed following the standard protocol without intravenous contrast. Multiplanar CT image reconstructions of the cervical spine were also generated. COMPARISON:  None. FINDINGS: CT HEAD FINDINGS Brain: No acute intracranial hemorrhage. No midline shift or mass effect. Gray-white differentiation maintained. Unremarkable appearance of the ventricular system. Vascular: Unremarkable. Skull: No acute fracture.  No aggressive bone lesion identified. Sinuses/Orbits: Unremarkable appearance of the orbits. Mastoid air cells clear. No middle ear effusion. No significant sinus disease. Other: None CT CERVICAL SPINE FINDINGS Alignment: Craniocervical junction aligned. Anatomic alignment of the cervical elements, with straightening of the normal cervical  lordosis, likely positional. No subluxation. Skull base and vertebrae: No acute fracture at the skullbase. Vertebral body heights relatively maintained. No acute fracture identified. Soft tissues and spinal canal: Unremarkable cervical soft tissues. Lymph nodes are present, though not enlarged. Disc levels: Disc space narrowing with early endplate changes. No significant canal narrowing or significant foraminal narrowing. Upper chest: Unremarkable appearance of the lung apices. Enlargement of the left thyroid. Other: No bony canal narrowing. IMPRESSION: Head CT: Negative for acute intracranial abnormality. Cervical CT: No acute fracture or malalignment of the cervical spine. Left thyroid goiter.  Endocrine evaluation may be warranted. Electronically Signed   By: Corrie Mckusick D.O.   On: 06/29/2018 20:17   Ct Abdomen Pelvis W Contrast  Result Date: 06/29/2018 CLINICAL DATA:  57 year old female with a history of syncope. Left-sided chest pain EXAM: CT ANGIOGRAPHY CHEST CT ABDOMEN AND PELVIS WITH CONTRAST TECHNIQUE: Multidetector CT imaging of the chest was performed using the standard protocol during bolus administration of intravenous contrast. Multiplanar CT image reconstructions and MIPs were obtained to evaluate the vascular anatomy. Multidetector CT imaging of the abdomen and pelvis was performed using the standard protocol during bolus administration of intravenous contrast. CONTRAST:  110m ISOVUE-370 IOPAMIDOL (ISOVUE-370) INJECTION 76% COMPARISON:  None. FINDINGS: CTA CHEST FINDINGS Cardiovascular: Heart: No cardiomegaly. No pericardial fluid/thickening. No significant coronary calcifications. Aorta: Unremarkable course, caliber, contour of the thoracic aorta. No aneurysm or dissection flap. No periaortic fluid. Pulmonary arteries: No central, lobar, segmental, or proximal subsegmental filling defects. Mediastinum/Nodes: Rightward deviation of the trachea secondary to enlargement of the left thyroid.  Greatest diameter of the left thyroid tissue measures approximately 4.4 cm on the axial images. Unremarkable course of the thoracic esophagus. No lymphadenopathy. Lungs/Pleura: Central airways are clear. No pleural effusion. No confluent airspace disease. No pneumothorax. Musculoskeletal: No acute displaced fracture. Degenerative changes of the thoracic spine. Review of the MIP images confirms the above findings. CT ABDOMEN and PELVIS FINDINGS Hepatobiliary: There are a few small hypodense lesions of liver parenchyma. The largest measures 17 mm in segment 6. Overall, features are most compatible with benign cystic lesions. Calcified cholelithiasis without associated inflammation. Pancreas: Unremarkable pancreas Spleen: Unremarkable spleen Adrenals/Urinary Tract: Unremarkable appearance of the adrenal glands. No evidence of hydronephrosis of the right or left kidney. No nephrolithiasis. Unremarkable course of the bilateral ureters. Unremarkable appearance of the urinary bladder. Stomach/Bowel: Surgical changes of the stomach. Small bowel unremarkable with no abnormal distention. No transition point. Laxity of  the ventral abdominal wall, with focal hernia on the patient's right lower abdominal wall containing small bowel loops. No transition point. There is a smaller hernia sac in the supraumbilical region containing transverse colon with no transition point. Normal appendix. Colonic diverticula without associated inflammation. Vascular/Lymphatic: No significant atherosclerosis. Mesenteric, renal, bilateral lower extremity arteries are patent. No abdominal lymphadenopathy. No retroperitoneal or pelvic lymphadenopathy. Reproductive: Hysterectomy Other: Additional hernia of the left lower abdominal wall containing fat. Musculoskeletal: No displaced fracture. Degenerative changes of the thoracolumbar spine. Degenerative changes of the hips. Review of the MIP images confirms the above findings. IMPRESSION: No acute CT  finding of the chest, abdomen, pelvis. Left thyroid goiter.  Endocrine evaluation may be considered. Cholelithiasis without evidence of acute cholecystitis. Low-density, nonenhancing lesions of the liver, most likely benign biliary cysts. Surgical changes of the stomach. There are 3 ventral wall hernias. Supraumbilical hernia contains transverse colon without obstruction. Right lower abdominal wall hernia contains small bowel loops, without obstruction. Left lower abdominal hernia contains mesenteric fat. Diverticular disease without evidence of acute diverticulitis. Electronically Signed   By: Corrie Mckusick D.O.   On: 06/29/2018 20:28    Pending Labs Unresulted Labs (From admission, onward)   Start     Ordered   06/29/18 2154  Urinalysis, Routine w reflex microscopic  Once,   R     06/29/18 2153   06/29/18 2154  Urine rapid drug screen (hosp performed)  STAT,   R     06/29/18 2153   06/29/18 2154  T4, free  Once,   R     06/29/18 2153   06/29/18 2154  T3  Once,   R     06/29/18 2153   06/29/18 2154  Troponin I (q 6hr x 3)  Now then every 6 hours,   R     06/29/18 2153   06/29/18 2144  TSH  STAT,   STAT     06/29/18 2143   Signed and Held  HIV antibody (Routine Testing)  Tomorrow morning,   R     Signed and Held   Signed and Held  Hemoglobin A1c  Tomorrow morning,   R     Signed and Held   Signed and Held  Comprehensive metabolic panel  Tomorrow morning,   R     Signed and Held   Signed and Held  CBC  Tomorrow morning,   R     Signed and Held      Vitals/Pain Today's Vitals   06/29/18 1830 06/29/18 1915 06/29/18 1930 06/29/18 2154  BP: 115/65 120/69 127/80   Pulse: 60 (!) 45 (!) 57   Resp: '12 11 16   '$ Temp:      TempSrc:      SpO2: 98% 90% 95%   PainSc:   10-Worst pain ever 5     Isolation Precautions No active isolations  Medications Medications  iopamidol (ISOVUE-370) 76 % injection (has no administration in time range)  sodium chloride 0.9 % bolus 1,000 mL (0 mLs  Intravenous Stopped 06/29/18 1916)  HYDROmorphone (DILAUDID) injection 1 mg (1 mg Intravenous Given 06/29/18 1808)  ondansetron (ZOFRAN) injection 4 mg (4 mg Intravenous Given 06/29/18 1807)  HYDROmorphone (DILAUDID) injection 1 mg (1 mg Intravenous Given 06/29/18 1932)  ondansetron (ZOFRAN) injection 4 mg (4 mg Intravenous Given 06/29/18 1932)  iopamidol (ISOVUE-370) 76 % injection 100 mL (100 mLs Intravenous Contrast Given 06/29/18 1945)  promethazine (PHENERGAN) injection 25 mg (25 mg Intravenous Given 06/29/18 2113)  Mobility walks  

## 2018-06-29 NOTE — ED Triage Notes (Signed)
Pt arrives via EMS with reports of a syncopal episode. Pt bent down to get something off the floor and then passed out. Pt endorses Right wrist swelling and deformity and Left sided rib pain. Pt A&Ox4. 100 mcg fentanyl and 4 mg zofran given

## 2018-06-29 NOTE — H&P (Addendum)
Andrea Carrillo SVX:[id number] DOB: 12-15-1960 DOA: 06/29/2018     PCP: [facility]   Outpatient Specialists:      Oncology Dr. Georgiann Cocker Patient arrived to ER on 06/29/18 at 1740  Patient coming from:  home Lives  With family   Chief Complaint:  Chief Complaint  Patient presents with  . Loss of Consciousness    HPI: Andrea Carrillo is a 57 y.o. female with medical history significant of breast cancer status post total mastectomy history of diverticulitis history of ventral hernia, hx of osteoporosis, and osteoarthritis    Presented with  Fall while at [employer] patient fell down She was putting a creamer into her coffee and then end up on the flour. She does not think she tripped she thinks likely passed out.   She had a spontaneous rib fracture few months ago. Bone scan done recently showed severe osteoporosis.  Now her mobility is limited secondary to bilateral knee arthritis and history of breast cancer she has been having intermittent lightheadedness Is been seen for this by her oncologist in 2018  A Chin has history of slow heart rate chronically And used to be a marathon runner. Had extensive cardiac workup in the past which was unremarkable at 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 CT Angio Chest PE W and/or Wo Contrast  . CT ABDOMEN PELVIS W CONTRAST  . CT Head Wo Contrast  . CT Cervical Spine Wo Contrast  . DG Chest 2 View  . DG Forearm Right  . CBC with Differential/Platelet  . Comprehensive metabolic panel  . Apply splint long arm   . Sling immobilizer  . Splint wrist  . Consult to hand surgery  . Consult for [facility] Admission  . I-stat troponin, ED  . I-Stat Beta hCG blood, ED ([facility], [facility], [facility] only)  . EKG 12-Lead  . EKG 12-Lead      Following Medications were ordered in ER: Medications  iopamidol (ISOVUE-370) 76 % injection (has no administration in time range)  sodium chloride 0.9 % bolus 1,000 mL (0 mLs Intravenous Stopped 06/29/18 1916)  HYDROmorphone (DILAUDID) injection 1 mg (1 mg Intravenous Given 06/29/18 1808)  ondansetron (ZOFRAN) injection 4 mg (4 mg Intravenous Given 06/29/18 1807)  HYDROmorphone (DILAUDID) injection 1 mg (1 mg Intravenous Given 06/29/18 1932)  ondansetron (ZOFRAN) injection 4 mg (4 mg Intravenous Given 06/29/18 1932)  iopamidol (ISOVUE-370) 76 % injection 100 mL (100 mLs Intravenous Contrast Given 06/29/18 1945)  promethazine (PHENERGAN) injection 25 mg (25 mg Intravenous Given 06/29/18 2113)    Significant initial  Findings: Abnormal Labs Reviewed  COMPREHENSIVE METABOLIC PANEL - Abnormal; Notable for the following components:      Result Value   Glucose, Bld 153 (*)    All other components within normal limits   0.0  Na 141 K  4.4  Cr    Stable,  Lab Results  Component Value Date   CREATININE 0.69 06/29/2018   CREATININE 0.5 01/07/2011      WBC 7.4  HG/HCT stable,       Component Value Date/Time  HGB 13.6 06/29/2018 1802   HCT 43.4 06/29/2018 1802     Troponin (Point of Care Test) Recent Labs    06/29/18 1816  TROPIPOC 0.00    BNP (last 3 results) No results for input(s): BNP in the last 8760 hours.  ProBNP (last 3 results) No results for input(s): PROBNP in the last 8760 hours.  Lactic Acid, Venous No results found for: LATICACIDVEN    UA   ordered   CT HEAD  NON acute Thyroid goiter noted CXR - NON acute Right forearm Distal radius and ulnar styloid fractures CTabd/pelvis -  nonacute hernia noted  ECG:  Personally reviewed by me  showing: HR : 51 Rhythm: sinus brady   no evidence of ischemic changes QTC 406     ED Triage Vitals  Enc Vitals Group     BP 06/29/18 1741 103/66     Pulse Rate 06/29/18 1741 61     Resp 06/29/18 1741 16     Temp 06/29/18 1741 97.9 F (36.6 C)     Temp Source 06/29/18 1741 Oral     SpO2 06/29/18 1741 99 %     Weight --      Height --      Head Circumference --      Peak Flow --      Pain Score 06/29/18 1816 10     Pain Loc --      Pain Edu? --      Excl. in [place]? --   TMAX(24)@       Latest  Blood pressure 127/80, pulse (!) 57, temperature 97.9 F (36.6 C), temperature source Oral, resp. rate 16, last menstrual period 06/21/2018, SpO2 95 %.    ER Provider Called:     Dr.Ortman They Recommend as an outpatient immobilize shoulder and placed splint    Hospitalist was called for admission for syncope with possibly symptomatic bradycardia   Review of Systems:    Pertinent positives include: syncope  Constitutional:  No weight loss, night sweats, Fevers, chills, fatigue, weight loss  HEENT:  No headaches, Difficulty swallowing,Tooth/dental problems,Sore throat,  No sneezing, itching, ear ache, nasal congestion, post nasal drip,  Cardio-vascular:  No chest pain, Orthopnea, PND, anasarca, dizziness, palpitations.no Bilateral lower extremity swelling  GI:  No heartburn, indigestion, abdominal pain, nausea, vomiting, diarrhea, change in bowel habits, loss of appetite, melena, blood in stool, hematemesis Resp:  no shortness of breath at rest. No dyspnea on exertion, No excess mucus, no productive cough, No non-productive cough, No coughing up of blood.No change in color of mucus.No wheezing. Skin:  no rash or lesions. No jaundice GU:  no dysuria, change in color of urine, no urgency or frequency. No straining to urinate.  No flank pain.  Musculoskeletal:  No joint pain or no joint swelling. No decreased range of motion. No back pain.  Psych:  No change in mood or  affect. No depression or anxiety. No memory loss.  Neuro: no localizing neurological complaints, no tingling, no weakness, no double vision, no gait abnormality, no slurred speech, no confusion  All systems reviewed and apart from [place] all are negative  Past Medical History:   Past Medical History:  Diagnosis Date  . Acid reflux   . Arthritis   . Asthma   . Attention deficit   . Osteoporosis       History reviewed. No pertinent surgical history.  Social History:  Ambulatory   independently      reports that she has  never smoked. She does not have any smokeless tobacco history on file. She reports that she does not drink alcohol or use drugs.     Family History:   Family History  Problem Relation Age of Onset  . Thyroid cancer Mother   . Hypertension Mother   . Diabetes Other   . CAD Neg Hx   . Stroke Neg Hx     Allergies: Allergies  Allergen Reactions  . Latex Hives and Itching     Prior to Admission medications   Medication Sig Start Date End Date Taking? Authorizing Provider  letrozole (FEMARA) 2.5 MG tablet Take 2.5 mg by mouth every evening.  11/04/17  Yes [provider]  levothyroxine (SYNTHROID, LEVOTHROID) 25 MCG tablet Take 25 mcg by mouth every evening.    Yes [provider]  omeprazole (PRILOSEC) 20 MG capsule Take 20 mg by mouth every evening. 05/03/14  Yes [provider]   Physical Exam: Blood pressure 127/80, pulse (!) 57, temperature 97.9 F (36.6 C), temperature source Oral, resp. rate 16, last menstrual period 06/21/2018, SpO2 95 %. 1. General:  in No Acute distress  well  -appearing 2. Psychological: Alert and    Oriented 3. Head/ENT:     Dry Mucous Membranes                          Head Non traumatic, neck supple                          Poor Dentition 4. SKIN:  decreased Skin turgor,  Skin clean Dry and intact no rash 5. Heart: Regular rate and rhythm no  Murmur, no Rub or gallop 6. Lungs:  Clear to  auscultation bilaterally, no wheezes or crackles   7. Abdomen: Soft,  non-tender, Non distended  bowel sounds present 8. Lower extremities: no clubbing, cyanosis, or edema 9. Neurologically Grossly intact, moving all 4 extremities equally   10. MSK: Normal range of motion   LABS:     Recent Labs  Lab 06/29/18 1802  WBC 7.4  NEUTROABS 5.4  HGB 13.6  HCT 43.4  MCV 91.2  PLT 195   Basic Metabolic Panel: Recent Labs  Lab 06/29/18 1802  NA 141  K 4.4  CL 106  CO2 25  GLUCOSE 153*  BUN 15  CREATININE 0.69  CALCIUM 9.2      Recent Labs  Lab 06/29/18 1802  AST 21  ALT 15  ALKPHOS 67  BILITOT 0.7  PROT 6.5  ALBUMIN 3.7   No results for input(s): LIPASE, AMYLASE in the last 168 hours. No results for input(s): AMMONIA in the last 168 hours.    HbA1C: No results for input(s): HGBA1C in the last 72 hours. CBG: No results for input(s): GLUCAP in the last 168 hours.    Urine analysis:    Component Value Date/Time   LABSPEC 1.025 01/07/2011 1021   PHURINE 5.5 01/07/2011 1021   HGBUR LARGE (A) 01/07/2011 1021   BILIRUBINUR NEGATIVE 01/07/2011 1021   KETONESUR NEGATIVE 01/07/2011 1021   PROTEINUR 30 (A) 01/07/2011 1021   UROBILINOGEN 0.2 01/07/2011 1021   NITRITE NEGATIVE 01/07/2011 1021   LEUKOCYTESUR  01/07/2011 1021    NEGATIVE Biochemical Testing Only. Please order routine urinalysis from main lab if confirmatory testing is needed.       Cultures: No results found for: SDES, [place], [place], REPTSTATUS   Radiological Exams on Admission:  Dg Chest 2 View  Result Date: 06/29/2018 CLINICAL DATA:  Syncopal episode resulting in a fall today. EXAM: CHEST - 2 VIEW COMPARISON:  None. FINDINGS: Normal sized heart. Clear lungs. Mild central peribronchial thickening. Bilateral axillary surgical clips. No fracture or pneumothorax. Normal vascularity. IMPRESSION: No acute abnormality.  Mild chronic bronchitic changes. Electronically Signed   By: Claudie Revering M.D.    On: 06/29/2018 19:47   Dg Forearm Right  Result Date: 06/29/2018 CLINICAL DATA:  Distal right forearm and wrist pain following a fall today. EXAM: RIGHT FOREARM - 2 VIEW COMPARISON:  None. FINDINGS: Mildly comminuted, impacted fracture of the distal radial metaphysis with a suggestion of mild dorsal displacement and angulation of the distal fragment on an oblique lateral view. There is no true lateral view available for accurate assessment. Also demonstrated is a nondisplaced fracture through the base of the ulnar styloid. IMPRESSION: Distal radius and ulnar styloid fractures, as described above. A true lateral view of the wrist would be necessary to accurately assess alignment. Electronically Signed   By: Claudie Revering M.D.   On: 06/29/2018 19:45   Ct Head Wo Contrast  Result Date: 06/29/2018 CLINICAL DATA:  57 year old female with a history of syncope EXAM: CT HEAD WITHOUT CONTRAST CT CERVICAL SPINE WITHOUT CONTRAST TECHNIQUE: Multidetector CT imaging of the head and cervical spine was performed following the standard protocol without intravenous contrast. Multiplanar CT image reconstructions of the cervical spine were also generated. COMPARISON:  None. FINDINGS: CT HEAD FINDINGS Brain: No acute intracranial hemorrhage. No midline shift or mass effect. Gray-white differentiation maintained. Unremarkable appearance of the ventricular system. Vascular: Unremarkable. Skull: No acute fracture.  No aggressive bone lesion identified. Sinuses/Orbits: Unremarkable appearance of the orbits. Mastoid air cells clear. No middle ear effusion. No significant sinus disease. Other: None CT CERVICAL SPINE FINDINGS Alignment: Craniocervical junction aligned. Anatomic alignment of the cervical elements, with straightening of the normal cervical lordosis, likely positional. No subluxation. Skull base and vertebrae: No acute fracture at the skullbase. Vertebral body heights relatively maintained. No acute fracture identified.  Soft tissues and spinal canal: Unremarkable cervical soft tissues. Lymph nodes are present, though not enlarged. Disc levels: Disc space narrowing with early endplate changes. No significant canal narrowing or significant foraminal narrowing. Upper chest: Unremarkable appearance of the lung apices. Enlargement of the left thyroid. Other: No bony canal narrowing. IMPRESSION: Head CT: Negative for acute intracranial abnormality. Cervical CT: No acute fracture or malalignment of the cervical spine. Left thyroid goiter.  Endocrine evaluation may be warranted. Electronically Signed   By: Corrie Mckusick D.O.   On: 06/29/2018 20:17   Ct Angio Chest Pe W And/or Wo Contrast  Result Date: 06/29/2018 CLINICAL DATA:  57 year old female with a history of syncope. Left-sided chest pain EXAM: CT ANGIOGRAPHY CHEST CT ABDOMEN AND PELVIS WITH CONTRAST TECHNIQUE: Multidetector CT imaging of the chest was performed using the standard protocol during bolus administration of intravenous contrast. Multiplanar CT image reconstructions and MIPs were obtained to evaluate the vascular anatomy. Multidetector CT imaging of the abdomen and pelvis was performed using the standard protocol during bolus administration of intravenous contrast. CONTRAST:  179mL ISOVUE-370 IOPAMIDOL (ISOVUE-370) INJECTION 76% COMPARISON:  None. FINDINGS: CTA CHEST FINDINGS Cardiovascular: Heart: No cardiomegaly. No pericardial fluid/thickening. No significant coronary calcifications. Aorta: Unremarkable course, caliber, contour of the thoracic aorta. No aneurysm or dissection flap. No periaortic fluid. Pulmonary arteries: No central, lobar, segmental, or proximal subsegmental filling defects. Mediastinum/Nodes: Rightward deviation of the trachea secondary to enlargement of  the left thyroid. Greatest diameter of the left thyroid tissue measures approximately 4.4 cm on the axial images. Unremarkable course of the thoracic esophagus. No lymphadenopathy. Lungs/Pleura:  Central airways are clear. No pleural effusion. No confluent airspace disease. No pneumothorax. Musculoskeletal: No acute displaced fracture. Degenerative changes of the thoracic spine. Review of the MIP images confirms the above findings. CT ABDOMEN and PELVIS FINDINGS Hepatobiliary: There are a few small hypodense lesions of liver parenchyma. The largest measures 17 mm in segment 6. Overall, features are most compatible with benign cystic lesions. Calcified cholelithiasis without associated inflammation. Pancreas: Unremarkable pancreas Spleen: Unremarkable spleen Adrenals/Urinary Tract: Unremarkable appearance of the adrenal glands. No evidence of hydronephrosis of the right or left kidney. No nephrolithiasis. Unremarkable course of the bilateral ureters. Unremarkable appearance of the urinary bladder. Stomach/Bowel: Surgical changes of the stomach. Small bowel unremarkable with no abnormal distention. No transition point. Laxity of the ventral abdominal wall, with focal hernia on the patient's right lower abdominal wall containing small bowel loops. No transition point. There is a smaller hernia sac in the supraumbilical region containing transverse colon with no transition point. Normal appendix. Colonic diverticula without associated inflammation. Vascular/Lymphatic: No significant atherosclerosis. Mesenteric, renal, bilateral lower extremity arteries are patent. No abdominal lymphadenopathy. No retroperitoneal or pelvic lymphadenopathy. Reproductive: Hysterectomy Other: Additional hernia of the left lower abdominal wall containing fat. Musculoskeletal: No displaced fracture. Degenerative changes of the thoracolumbar spine. Degenerative changes of the hips. Review of the MIP images confirms the above findings. IMPRESSION: No acute CT finding of the chest, abdomen, pelvis. Left thyroid goiter.  Endocrine evaluation may be considered. Cholelithiasis without evidence of acute cholecystitis. Low-density,  nonenhancing lesions of the liver, most likely benign biliary cysts. Surgical changes of the stomach. There are 3 ventral wall hernias. Supraumbilical hernia contains transverse colon without obstruction. Right lower abdominal wall hernia contains small bowel loops, without obstruction. Left lower abdominal hernia contains mesenteric fat. Diverticular disease without evidence of acute diverticulitis. Electronically Signed   By: Corrie Mckusick D.O.   On: 06/29/2018 20:28   Ct Cervical Spine Wo Contrast  Result Date: 06/29/2018 CLINICAL DATA:  57 year old female with a history of syncope EXAM: CT HEAD WITHOUT CONTRAST CT CERVICAL SPINE WITHOUT CONTRAST TECHNIQUE: Multidetector CT imaging of the head and cervical spine was performed following the standard protocol without intravenous contrast. Multiplanar CT image reconstructions of the cervical spine were also generated. COMPARISON:  None. FINDINGS: CT HEAD FINDINGS Brain: No acute intracranial hemorrhage. No midline shift or mass effect. Gray-white differentiation maintained. Unremarkable appearance of the ventricular system. Vascular: Unremarkable. Skull: No acute fracture.  No aggressive bone lesion identified. Sinuses/Orbits: Unremarkable appearance of the orbits. Mastoid air cells clear. No middle ear effusion. No significant sinus disease. Other: None CT CERVICAL SPINE FINDINGS Alignment: Craniocervical junction aligned. Anatomic alignment of the cervical elements, with straightening of the normal cervical lordosis, likely positional. No subluxation. Skull base and vertebrae: No acute fracture at the skullbase. Vertebral body heights relatively maintained. No acute fracture identified. Soft tissues and spinal canal: Unremarkable cervical soft tissues. Lymph nodes are present, though not enlarged. Disc levels: Disc space narrowing with early endplate changes. No significant canal narrowing or significant foraminal narrowing. Upper chest: Unremarkable appearance  of the lung apices. Enlargement of the left thyroid. Other: No bony canal narrowing. IMPRESSION: Head CT: Negative for acute intracranial abnormality. Cervical CT: No acute fracture or malalignment of the cervical spine. Left thyroid goiter.  Endocrine evaluation may be warranted. Electronically Signed   By:  Corrie Mckusick D.O.   On: 06/29/2018 20:17   Ct Abdomen Pelvis W Contrast  Result Date: 06/29/2018 CLINICAL DATA:  57 year old female with a history of syncope. Left-sided chest pain EXAM: CT ANGIOGRAPHY CHEST CT ABDOMEN AND PELVIS WITH CONTRAST TECHNIQUE: Multidetector CT imaging of the chest was performed using the standard protocol during bolus administration of intravenous contrast. Multiplanar CT image reconstructions and MIPs were obtained to evaluate the vascular anatomy. Multidetector CT imaging of the abdomen and pelvis was performed using the standard protocol during bolus administration of intravenous contrast. CONTRAST:  133mL ISOVUE-370 IOPAMIDOL (ISOVUE-370) INJECTION 76% COMPARISON:  None. FINDINGS: CTA CHEST FINDINGS Cardiovascular: Heart: No cardiomegaly. No pericardial fluid/thickening. No significant coronary calcifications. Aorta: Unremarkable course, caliber, contour of the thoracic aorta. No aneurysm or dissection flap. No periaortic fluid. Pulmonary arteries: No central, lobar, segmental, or proximal subsegmental filling defects. Mediastinum/Nodes: Rightward deviation of the trachea secondary to enlargement of the left thyroid. Greatest diameter of the left thyroid tissue measures approximately 4.4 cm on the axial images. Unremarkable course of the thoracic esophagus. No lymphadenopathy. Lungs/Pleura: Central airways are clear. No pleural effusion. No confluent airspace disease. No pneumothorax. Musculoskeletal: No acute displaced fracture. Degenerative changes of the thoracic spine. Review of the MIP images confirms the above findings. CT ABDOMEN and PELVIS FINDINGS Hepatobiliary: There  are a few small hypodense lesions of liver parenchyma. The largest measures 17 mm in segment 6. Overall, features are most compatible with benign cystic lesions. Calcified cholelithiasis without associated inflammation. Pancreas: Unremarkable pancreas Spleen: Unremarkable spleen Adrenals/Urinary Tract: Unremarkable appearance of the adrenal glands. No evidence of hydronephrosis of the right or left kidney. No nephrolithiasis. Unremarkable course of the bilateral ureters. Unremarkable appearance of the urinary bladder. Stomach/Bowel: Surgical changes of the stomach. Small bowel unremarkable with no abnormal distention. No transition point. Laxity of the ventral abdominal wall, with focal hernia on the patient's right lower abdominal wall containing small bowel loops. No transition point. There is a smaller hernia sac in the supraumbilical region containing transverse colon with no transition point. Normal appendix. Colonic diverticula without associated inflammation. Vascular/Lymphatic: No significant atherosclerosis. Mesenteric, renal, bilateral lower extremity arteries are patent. No abdominal lymphadenopathy. No retroperitoneal or pelvic lymphadenopathy. Reproductive: Hysterectomy Other: Additional hernia of the left lower abdominal wall containing fat. Musculoskeletal: No displaced fracture. Degenerative changes of the thoracolumbar spine. Degenerative changes of the hips. Review of the MIP images confirms the above findings. IMPRESSION: No acute CT finding of the chest, abdomen, pelvis. Left thyroid goiter.  Endocrine evaluation may be considered. Cholelithiasis without evidence of acute cholecystitis. Low-density, nonenhancing lesions of the liver, most likely benign biliary cysts. Surgical changes of the stomach. There are 3 ventral wall hernias. Supraumbilical hernia contains transverse colon without obstruction. Right lower abdominal wall hernia contains small bowel loops, without obstruction. Left lower  abdominal hernia contains mesenteric fat. Diverticular disease without evidence of acute diverticulitis. Electronically Signed   By: Corrie Mckusick D.O.   On: 06/29/2018 20:28    Chart has been reviewed    Assessment/Plan  57 y.o. female with medical history significant of breast cancer status post total mastectomy history of diverticulitis history of ventral hernia, hx of osteoporosis, and osteoarthritis  Admitted for Syncope sudden with no prodrome so far negative workup history of chronic bradycardia  Present on Admission: . Syncope -  given risk factor will admit rehydrate obtain CE, monitor on tele and obtain carotid dopplers Given sudden onset no prodromal symptoms obtain echogram email cardiology patient has been  admitted   . Breast cancer (HCC)chronic continue home medications follow up with oncology . Thyroid goiter Chronic check TSH Bradycardia chronic currently heart rate appears to be above 60s symptomatic  Right knee and fracture - follow-up with orthopedics as an outpatient pain management ordered Splint in place Other plan as per orders.  DVT prophylaxis:    Lovenox     Code Status:  FULL CODE as per patient   I had personally discussed CODE STATUS with patient and family     Family Communication:   Family not  at  Bedside   Husband,  Disposition Plan:    To home once workup is complete and patient is stable                                      Consults called: email cardiology     Admission status:     obs   Level of care     tele          Toy Baker 06/29/2018, 11:03 PM    [facility]  Pager [phone]   after 2 AM please page floor coverage PA If 7AM-7PM, please contact the day team taking care of the patient  [email or web address]  Password [email or web address]

## 2018-06-29 NOTE — ED Notes (Signed)
Pt. To CT via stretcher. 

## 2018-06-30 ENCOUNTER — Observation Stay (HOSPITAL_BASED_OUTPATIENT_CLINIC_OR_DEPARTMENT_OTHER): Payer: BLUE CROSS/BLUE SHIELD

## 2018-06-30 ENCOUNTER — Other Ambulatory Visit: Payer: Self-pay | Admitting: Cardiology

## 2018-06-30 ENCOUNTER — Observation Stay (HOSPITAL_COMMUNITY): Payer: BLUE CROSS/BLUE SHIELD

## 2018-06-30 ENCOUNTER — Encounter (HOSPITAL_COMMUNITY): Payer: Self-pay | Admitting: Internal Medicine

## 2018-06-30 DIAGNOSIS — M25552 Pain in left hip: Secondary | ICD-10-CM | POA: Diagnosis present

## 2018-06-30 DIAGNOSIS — S52611A Displaced fracture of right ulna styloid process, initial encounter for closed fracture: Secondary | ICD-10-CM | POA: Diagnosis not present

## 2018-06-30 DIAGNOSIS — C50911 Malignant neoplasm of unspecified site of right female breast: Secondary | ICD-10-CM | POA: Diagnosis not present

## 2018-06-30 DIAGNOSIS — M810 Age-related osteoporosis without current pathological fracture: Secondary | ICD-10-CM | POA: Diagnosis present

## 2018-06-30 DIAGNOSIS — R55 Syncope and collapse: Secondary | ICD-10-CM | POA: Diagnosis not present

## 2018-06-30 DIAGNOSIS — S52501A Unspecified fracture of the lower end of right radius, initial encounter for closed fracture: Secondary | ICD-10-CM | POA: Diagnosis not present

## 2018-06-30 DIAGNOSIS — E039 Hypothyroidism, unspecified: Secondary | ICD-10-CM

## 2018-06-30 DIAGNOSIS — E049 Nontoxic goiter, unspecified: Secondary | ICD-10-CM | POA: Diagnosis not present

## 2018-06-30 HISTORY — DX: Hypothyroidism, unspecified: E03.9

## 2018-06-30 LAB — CBC
HEMATOCRIT: 40.4 % (ref 36.0–46.0)
Hemoglobin: 12.7 g/dL (ref 12.0–15.0)
MCH: 28.5 pg (ref 26.0–34.0)
MCHC: 31.4 g/dL (ref 30.0–36.0)
MCV: 90.8 fL (ref 78.0–100.0)
PLATELETS: 215 10*3/uL (ref 150–400)
RBC: 4.45 MIL/uL (ref 3.87–5.11)
RDW: 13.7 % (ref 11.5–15.5)
WBC: 7.1 10*3/uL (ref 4.0–10.5)

## 2018-06-30 LAB — TROPONIN I: Troponin I: <0.03 ng/mL (ref <0.03)

## 2018-06-30 LAB — HIV ANTIBODY (ROUTINE TESTING W REFLEX): HIV SCREEN 4TH GENERATION: NONREACTIVE

## 2018-06-30 LAB — COMPREHENSIVE METABOLIC PANEL
ALBUMIN: 3.3 g/dL — AB (ref 3.5–5.0)
ALT: 15 U/L (ref 0–44)
AST: 15 U/L (ref 15–41)
Alkaline Phosphatase: 56 U/L (ref 38–126)
Anion gap: 10 (ref 5–15)
BUN: 11 mg/dL (ref 6–20)
CHLORIDE: 104 mmol/L (ref 98–111)
CO2: 26 mmol/L (ref 22–32)
CREATININE: 0.57 mg/dL (ref 0.44–1.00)
Calcium: 8.7 mg/dL — ABNORMAL LOW (ref 8.9–10.3)
GFR calc Af Amer: >60 mL/min (ref >60)
GLUCOSE: 100 mg/dL — AB (ref 70–99)
Potassium: 4.3 mmol/L (ref 3.5–5.1)
Sodium: 140 mmol/L (ref 135–145)
Total Bilirubin: 0.7 mg/dL (ref 0.3–1.2)
Total Protein: 5.9 g/dL — ABNORMAL LOW (ref 6.5–8.1)

## 2018-06-30 LAB — HEMOGLOBIN A1C
HEMOGLOBIN A1C: 5.3 % (ref 4.8–5.6)
Mean Plasma Glucose: 105.41 mg/dL

## 2018-06-30 LAB — ECHOCARDIOGRAM COMPLETE

## 2018-06-30 MED ORDER — ALENDRONATE SODIUM 10 MG PO TABS: 10.0000 mg | ORAL_TABLET | Freq: Every day | ORAL | Status: DC

## 2018-06-30 MED ORDER — KETOROLAC TROMETHAMINE 30 MG/ML IJ SOLN
30.0000 mg | Freq: Four times a day (QID) | INTRAMUSCULAR | Status: DC | PRN
  Administered 2018-06-30: 30 mg via INTRAVENOUS
  Filled 2018-06-30: qty 1

## 2018-06-30 MED ORDER — PERFLUTREN LIPID MICROSPHERE
4.0000 mL | INTRAVENOUS | Status: AC | PRN
  Administered 2018-06-30: 4 mL via INTRAVENOUS
  Filled 2018-06-30: qty 4

## 2018-06-30 MED ORDER — LETROZOLE 2.5 MG PO TABS
2.5000 mg | ORAL_TABLET | Freq: Every evening | ORAL | Status: DC
  Administered 2018-06-30: 2.5 mg via ORAL
  Filled 2018-06-30: qty 1

## 2018-06-30 MED ORDER — PANTOPRAZOLE SODIUM 40 MG PO TBEC
40.0000 mg | DELAYED_RELEASE_TABLET | Freq: Every day | ORAL | Status: DC
  Administered 2018-06-30 – 2018-07-01 (×2): 40 mg via ORAL
  Filled 2018-06-30 (×2): qty 1

## 2018-06-30 MED ORDER — LEVOTHYROXINE SODIUM 25 MCG PO TABS
25.0000 ug | ORAL_TABLET | Freq: Every day | ORAL | Status: DC
  Administered 2018-06-30: 25 ug via ORAL
  Filled 2018-06-30: qty 1

## 2018-06-30 NOTE — Consult Note (Signed)
Reason for Consult:Wrist fx Referring Physician: D Thompson  Andrea Carrillo is an 56 y.o. female.  HPI: Reginia was at a [employer] and bent down to pick something up. She thinks she probably passed out and came to on the floor. She had right wrist pain but was unsure how she fell on it. She came to the ED where x-rays showed a distal radius/ulnar styloid fx and hand surgery was consulted. She was admitted for a syncopal workup. 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 She has a hx/o severe osteoporosis and has had a previous rib fx from bending over.  Past Medical History:  Diagnosis Date  . Acid reflux   . Arthritis   . Asthma   . Attention deficit   . Hypothyroidism 06/30/2018  . Osteoporosis     History reviewed. No pertinent surgical history.  Family History  Problem Relation Age of Onset  . Thyroid cancer Mother   . Hypertension Mother   . Diabetes Other   . CAD Neg Hx   . Stroke Neg Hx     Social History:  reports that she has never smoked. She has never used smokeless tobacco. She reports that she does not drink alcohol or use drugs.  Allergies:  Allergies  Allergen Reactions  . Latex Hives and Itching    Medications: I have reviewed the patient's current medications.  Results for orders placed or performed during the hospital encounter of 06/29/18 (from the past 48 hour(s))  CBC with Differential/Platelet     Status: None   Collection Time: 06/29/18  6:02 PM  Result Value Ref Range   WBC 7.4 4.0 - 10.5 K/uL   RBC 4.76 3.87 - 5.11 MIL/uL   Hemoglobin 13.6 12.0 - 15.0 g/dL   HCT 43.4 36.0 - 46.0 %   MCV 91.2 78.0 - 100.0 fL   MCH 28.6 26.0 - 34.0 pg   MCHC 31.3 30.0 - 36.0 g/dL   RDW 13.5 11.5 - 15.5 %   Platelets 215 150 - 400 K/uL   Neutrophils Relative % 74 %   Neutro Abs 5.4 1.7 - 7.7 K/uL   Lymphocytes Relative 19 %   Lymphs Abs 1.4 0.7 - 4.0 K/uL   Monocytes Relative 5 %   Monocytes Absolute 0.4 0.1 - 1.0 K/uL   Eosinophils Relative 1 %   Eosinophils Absolute 0.1  0.0 - 0.7 K/uL   Basophils Relative 0 %   Basophils Absolute 0.0 0.0 - 0.1 K/uL   Immature Granulocytes 1 %   Abs Immature Granulocytes 0.0 0.0 - 0.1 K/uL    Comment: Performed at [facility], [address] [place], [place] [address]  Comprehensive metabolic panel     Status: Abnormal   Collection Time: 06/29/18  6:02 PM  Result Value Ref Range   Sodium 141 135 - 145 mmol/L   Potassium 4.4 3.5 - 5.1 mmol/L    Comment: HEMOLYSIS AT THIS LEVEL MAY AFFECT RESULT   Chloride 106 98 - 111 mmol/L   CO2 25 22 - 32 mmol/L   Glucose, Bld 153 (H) 70 - 99 mg/dL   BUN 15 6 - 20 mg/dL   Creatinine, Ser 0.69 0.44 - 1.00 mg/dL   Calcium 9.2 8.9 - 10.3 mg/dL   Total Protein 6.5 6.5 - 8.1 g/dL   Albumin 3.7 3.5 - 5.0 g/dL   AST 21 15 - 41 U/L   ALT 15 0 - 44 U/L   Alkaline Phosphatase 67 38 -   126 U/L   Total Bilirubin 0.7 0.3 - 1.2 mg/dL   GFR calc non Af Amer >60 >60 mL/min   GFR calc Af Amer >60 >60 mL/min    Comment: (NOTE) The eGFR has been calculated using the CKD EPI equation. This calculation has not been validated in all clinical situations. eGFR's persistently <60 mL/min signify possible Chronic Kidney Disease.    Anion gap 10 5 - 15    Comment: Performed at [facility] [address] [place], [place] [address]  I-stat troponin, ED     Status: None   Collection Time: 06/29/18  6:16 PM  Result Value Ref Range   Troponin i, poc 0.00 0.00 - 0.08 ng/mL   Comment 3            Comment: Due to the release kinetics of cTnI, a negative result within the first hours of the onset of symptoms does not rule out myocardial infarction with certainty. If myocardial infarction is still suspected, repeat the test at appropriate intervals.   I-Stat Beta hCG blood, ED ([facility], [facility], [facility] only)     Status: None   Collection Time: 06/29/18  6:17 PM  Result Value Ref Range   I-stat hCG, quantitative <5.0 <5 mIU/mL   Comment 3            Comment:   GEST. AGE      CONC.  (mIU/mL)   <=1  WEEK        5 - 50     2 WEEKS       50 - 500     3 WEEKS       100 - 10,000     4 WEEKS     1,000 - 30,000        FEMALE AND NON-PREGNANT FEMALE:     LESS THAN 5 mIU/mL   TSH     Status: None   Collection Time: 06/29/18 10:26 PM  Result Value Ref Range   TSH 1.116 0.350 - 4.500 uIU/mL    Comment: Performed by a 3rd Generation assay with a functional sensitivity of <=0.01 uIU/mL. Performed at [facility], [place] [address], [place], [place] [address]   T4, free     Status: Abnormal   Collection Time: 06/29/18 10:26 PM  Result Value Ref Range   Free T4 0.67 (L) 0.82 - 1.77 ng/dL    Comment: (NOTE) Biotin ingestion may interfere with free T4 tests. If the results are inconsistent with the TSH level, previous test results, or the clinical presentation, then consider biotin interference. If needed, order repeat testing after stopping biotin. Performed at [facility], [place] [address], [place], [place] [address]   Troponin I (q 6hr x 3)     Status: None   Collection Time: 06/29/18 10:26 PM  Result Value Ref Range   Troponin I <0.03 <0.03 ng/mL    Comment: Performed at [facility] [address] [place], [place] [address]  Troponin I (q 6hr x 3)     Status: None   Collection Time: 06/30/18  4:06 AM  Result Value Ref Range   Troponin I <0.03 <0.03 ng/mL    Comment: Performed at [facility] [address] [place], [place] [address]  Hemoglobin A1c     Status: None   Collection Time: 06/30/18  4:06 AM  Result Value Ref Range   Hgb A1c MFr Bld 5.3 4.8 - 5.6 %  Comment: (NOTE) Pre diabetes:          5.7%-6.4% Diabetes:              >6.4% Glycemic control for   <7.0% adults with diabetes    Mean Plasma Glucose 105.41 mg/dL    Comment: Performed at [facility] [address], [place], [place] [address]  Comprehensive metabolic panel     Status: Abnormal   Collection Time: 06/30/18  4:06 AM  Result Value Ref Range   Sodium 140 135 - 145 mmol/L    Potassium 4.3 3.5 - 5.1 mmol/L   Chloride 104 98 - 111 mmol/L   CO2 26 22 - 32 mmol/L   Glucose, Bld 100 (H) 70 - 99 mg/dL   BUN 11 6 - 20 mg/dL   Creatinine, Ser 0.57 0.44 - 1.00 mg/dL   Calcium 8.7 (L) 8.9 - 10.3 mg/dL   Total Protein 5.9 (L) 6.5 - 8.1 g/dL   Albumin 3.3 (L) 3.5 - 5.0 g/dL   AST 15 15 - 41 U/L   ALT 15 0 - 44 U/L   Alkaline Phosphatase 56 38 - 126 U/L   Total Bilirubin 0.7 0.3 - 1.2 mg/dL   GFR calc non Af Amer >60 >60 mL/min   GFR calc Af Amer >60 >60 mL/min    Comment: (NOTE) The eGFR has been calculated using the CKD EPI equation. This calculation has not been validated in all clinical situations. eGFR's persistently <60 mL/min signify possible Chronic Kidney Disease.    Anion gap 10 5 - 15    Comment: Performed at [facility] [address] [place] [address]  CBC     Status: None   Collection Time: 06/30/18  4:06 AM  Result Value Ref Range   WBC 7.1 4.0 - 10.5 K/uL   RBC 4.45 3.87 - 5.11 MIL/uL   Hemoglobin 12.7 12.0 - 15.0 g/dL   HCT 40.4 36.0 - 46.0 %   MCV 90.8 78.0 - 100.0 fL   MCH 28.5 26.0 - 34.0 pg   MCHC 31.4 30.0 - 36.0 g/dL   RDW 13.7 11.5 - 15.5 %   Platelets 215 150 - 400 K/uL    Comment: Performed at [facility], [place] [address], [place], [place] [address]    Dg Chest 2 View  Result Date: 06/29/2018 CLINICAL DATA:  Syncopal episode resulting in a fall today. EXAM: CHEST - 2 VIEW COMPARISON:  None. FINDINGS: Normal sized heart. Clear lungs. Mild central peribronchial thickening. Bilateral axillary surgical clips. No fracture or pneumothorax. Normal vascularity. IMPRESSION: No acute abnormality.  Mild chronic bronchitic changes. Electronically Signed   By: Claudie Revering M.D.   On: 06/29/2018 19:47   Dg Forearm Right  Result Date: 06/29/2018 CLINICAL DATA:  Distal right forearm and wrist pain following a fall today. EXAM: RIGHT FOREARM - 2 VIEW COMPARISON:  None. FINDINGS: Mildly comminuted, impacted fracture of the  distal radial metaphysis with a suggestion of mild dorsal displacement and angulation of the distal fragment on an oblique lateral view. There is no true lateral view available for accurate assessment. Also demonstrated is a nondisplaced fracture through the base of the ulnar styloid. IMPRESSION: Distal radius and ulnar styloid fractures, as described above. A true lateral view of the wrist would be necessary to accurately assess alignment. Electronically Signed   By: Claudie Revering M.D.   On: 06/29/2018 19:45   Ct Head Wo Contrast  Result Date: 06/29/2018  CLINICAL DATA:  57 year old female with a history of syncope EXAM: CT HEAD WITHOUT CONTRAST CT CERVICAL SPINE WITHOUT CONTRAST TECHNIQUE: Multidetector CT imaging of the head and cervical spine was performed following the standard protocol without intravenous contrast. Multiplanar CT image reconstructions of the cervical spine were also generated. COMPARISON:  None. FINDINGS: CT HEAD FINDINGS Brain: No acute intracranial hemorrhage. No midline shift or mass effect. Gray-white differentiation maintained. Unremarkable appearance of the ventricular system. Vascular: Unremarkable. Skull: No acute fracture.  No aggressive bone lesion identified. Sinuses/Orbits: Unremarkable appearance of the orbits. Mastoid air cells clear. No middle ear effusion. No significant sinus disease. Other: None CT CERVICAL SPINE FINDINGS Alignment: Craniocervical junction aligned. Anatomic alignment of the cervical elements, with straightening of the normal cervical lordosis, likely positional. No subluxation. Skull base and vertebrae: No acute fracture at the skullbase. Vertebral body heights relatively maintained. No acute fracture identified. Soft tissues and spinal canal: Unremarkable cervical soft tissues. Lymph nodes are present, though not enlarged. Disc levels: Disc space narrowing with early endplate changes. No significant canal narrowing or significant foraminal narrowing. Upper  chest: Unremarkable appearance of the lung apices. Enlargement of the left thyroid. Other: No bony canal narrowing. IMPRESSION: Head CT: Negative for acute intracranial abnormality. Cervical CT: No acute fracture or malalignment of the cervical spine. Left thyroid goiter.  Endocrine evaluation may be warranted. Electronically Signed   By: Corrie Mckusick D.O.   On: 06/29/2018 20:17   Ct Angio Chest Pe W And/or Wo Contrast  Result Date: 06/29/2018 CLINICAL DATA:  57 year old female with a history of syncope. Left-sided chest pain EXAM: CT ANGIOGRAPHY CHEST CT ABDOMEN AND PELVIS WITH CONTRAST TECHNIQUE: Multidetector CT imaging of the chest was performed using the standard protocol during bolus administration of intravenous contrast. Multiplanar CT image reconstructions and MIPs were obtained to evaluate the vascular anatomy. Multidetector CT imaging of the abdomen and pelvis was performed using the standard protocol during bolus administration of intravenous contrast. CONTRAST:  169m ISOVUE-370 IOPAMIDOL (ISOVUE-370) INJECTION 76% COMPARISON:  None. FINDINGS: CTA CHEST FINDINGS Cardiovascular: Heart: No cardiomegaly. No pericardial fluid/thickening. No significant coronary calcifications. Aorta: Unremarkable course, caliber, contour of the thoracic aorta. No aneurysm or dissection flap. No periaortic fluid. Pulmonary arteries: No central, lobar, segmental, or proximal subsegmental filling defects. Mediastinum/Nodes: Rightward deviation of the trachea secondary to enlargement of the left thyroid. Greatest diameter of the left thyroid tissue measures approximately 4.4 cm on the axial images. Unremarkable course of the thoracic esophagus. No lymphadenopathy. Lungs/Pleura: Central airways are clear. No pleural effusion. No confluent airspace disease. No pneumothorax. Musculoskeletal: No acute displaced fracture. Degenerative changes of the thoracic spine. Review of the MIP images confirms the above findings. CT ABDOMEN  and PELVIS FINDINGS Hepatobiliary: There are a few small hypodense lesions of liver parenchyma. The largest measures 17 mm in segment 6. Overall, features are most compatible with benign cystic lesions. Calcified cholelithiasis without associated inflammation. Pancreas: Unremarkable pancreas Spleen: Unremarkable spleen Adrenals/Urinary Tract: Unremarkable appearance of the adrenal glands. No evidence of hydronephrosis of the right or left kidney. No nephrolithiasis. Unremarkable course of the bilateral ureters. Unremarkable appearance of the urinary bladder. Stomach/Bowel: Surgical changes of the stomach. Small bowel unremarkable with no abnormal distention. No transition point. Laxity of the ventral abdominal wall, with focal hernia on the patient's right lower abdominal wall containing small bowel loops. No transition point. There is a smaller hernia sac in the supraumbilical region containing transverse colon with no transition point. Normal appendix. Colonic diverticula without associated inflammation.  Vascular/Lymphatic: No significant atherosclerosis. Mesenteric, renal, bilateral lower extremity arteries are patent. No abdominal lymphadenopathy. No retroperitoneal or pelvic lymphadenopathy. Reproductive: Hysterectomy Other: Additional hernia of the left lower abdominal wall containing fat. Musculoskeletal: No displaced fracture. Degenerative changes of the thoracolumbar spine. Degenerative changes of the hips. Review of the MIP images confirms the above findings. IMPRESSION: No acute CT finding of the chest, abdomen, pelvis. Left thyroid goiter.  Endocrine evaluation may be considered. Cholelithiasis without evidence of acute cholecystitis. Low-density, nonenhancing lesions of the liver, most likely benign biliary cysts. Surgical changes of the stomach. There are 3 ventral wall hernias. Supraumbilical hernia contains transverse colon without obstruction. Right lower abdominal wall hernia contains small bowel  loops, without obstruction. Left lower abdominal hernia contains mesenteric fat. Diverticular disease without evidence of acute diverticulitis. Electronically Signed   By: Jaime  Wagner D.O.   On: 06/29/2018 20:28   Ct Cervical Spine Wo Contrast  Result Date: 06/29/2018 CLINICAL DATA:  56-year-old female with a history of syncope EXAM: CT HEAD WITHOUT CONTRAST CT CERVICAL SPINE WITHOUT CONTRAST TECHNIQUE: Multidetector CT imaging of the head and cervical spine was performed following the standard protocol without intravenous contrast. Multiplanar CT image reconstructions of the cervical spine were also generated. COMPARISON:  None. FINDINGS: CT HEAD FINDINGS Brain: No acute intracranial hemorrhage. No midline shift or mass effect. Gray-white differentiation maintained. Unremarkable appearance of the ventricular system. Vascular: Unremarkable. Skull: No acute fracture.  No aggressive bone lesion identified. Sinuses/Orbits: Unremarkable appearance of the orbits. Mastoid air cells clear. No middle ear effusion. No significant sinus disease. Other: None CT CERVICAL SPINE FINDINGS Alignment: Craniocervical junction aligned. Anatomic alignment of the cervical elements, with straightening of the normal cervical lordosis, likely positional. No subluxation. Skull base and vertebrae: No acute fracture at the skullbase. Vertebral body heights relatively maintained. No acute fracture identified. Soft tissues and spinal canal: Unremarkable cervical soft tissues. Lymph nodes are present, though not enlarged. Disc levels: Disc space narrowing with early endplate changes. No significant canal narrowing or significant foraminal narrowing. Upper chest: Unremarkable appearance of the lung apices. Enlargement of the left thyroid. Other: No bony canal narrowing. IMPRESSION: Head CT: Negative for acute intracranial abnormality. Cervical CT: No acute fracture or malalignment of the cervical spine. Left thyroid goiter.  Endocrine  evaluation may be warranted. Electronically Signed   By: Jaime  Wagner D.O.   On: 06/29/2018 20:17   Ct Abdomen Pelvis W Contrast  Result Date: 06/29/2018 CLINICAL DATA:  56-year-old female with a history of syncope. Left-sided chest pain EXAM: CT ANGIOGRAPHY CHEST CT ABDOMEN AND PELVIS WITH CONTRAST TECHNIQUE: Multidetector CT imaging of the chest was performed using the standard protocol during bolus administration of intravenous contrast. Multiplanar CT image reconstructions and MIPs were obtained to evaluate the vascular anatomy. Multidetector CT imaging of the abdomen and pelvis was performed using the standard protocol during bolus administration of intravenous contrast. CONTRAST:  100mL ISOVUE-370 IOPAMIDOL (ISOVUE-370) INJECTION 76% COMPARISON:  None. FINDINGS: CTA CHEST FINDINGS Cardiovascular: Heart: No cardiomegaly. No pericardial fluid/thickening. No significant coronary calcifications. Aorta: Unremarkable course, caliber, contour of the thoracic aorta. No aneurysm or dissection flap. No periaortic fluid. Pulmonary arteries: No central, lobar, segmental, or proximal subsegmental filling defects. Mediastinum/Nodes: Rightward deviation of the trachea secondary to enlargement of the left thyroid. Greatest diameter of the left thyroid tissue measures approximately 4.4 cm on the axial images. Unremarkable course of the thoracic esophagus. No lymphadenopathy. Lungs/Pleura: Central airways are clear. No pleural effusion. No confluent airspace disease. No pneumothorax. Musculoskeletal:   No acute displaced fracture. Degenerative changes of the thoracic spine. Review of the MIP images confirms the above findings. CT ABDOMEN and PELVIS FINDINGS Hepatobiliary: There are a few small hypodense lesions of liver parenchyma. The largest measures 17 mm in segment 6. Overall, features are most compatible with benign cystic lesions. Calcified cholelithiasis without associated inflammation. Pancreas: Unremarkable pancreas  Spleen: Unremarkable spleen Adrenals/Urinary Tract: Unremarkable appearance of the adrenal glands. No evidence of hydronephrosis of the right or left kidney. No nephrolithiasis. Unremarkable course of the bilateral ureters. Unremarkable appearance of the urinary bladder. Stomach/Bowel: Surgical changes of the stomach. Small bowel unremarkable with no abnormal distention. No transition point. Laxity of the ventral abdominal wall, with focal hernia on the patient's right lower abdominal wall containing small bowel loops. No transition point. There is a smaller hernia sac in the supraumbilical region containing transverse colon with no transition point. Normal appendix. Colonic diverticula without associated inflammation. Vascular/Lymphatic: No significant atherosclerosis. Mesenteric, renal, bilateral lower extremity arteries are patent. No abdominal lymphadenopathy. No retroperitoneal or pelvic lymphadenopathy. Reproductive: Hysterectomy Other: Additional hernia of the left lower abdominal wall containing fat. Musculoskeletal: No displaced fracture. Degenerative changes of the thoracolumbar spine. Degenerative changes of the hips. Review of the MIP images confirms the above findings. IMPRESSION: No acute CT finding of the chest, abdomen, pelvis. Left thyroid goiter.  Endocrine evaluation may be considered. Cholelithiasis without evidence of acute cholecystitis. Low-density, nonenhancing lesions of the liver, most likely benign biliary cysts. Surgical changes of the stomach. There are 3 ventral wall hernias. Supraumbilical hernia contains transverse colon without obstruction. Right lower abdominal wall hernia contains small bowel loops, without obstruction. Left lower abdominal hernia contains mesenteric fat. Diverticular disease without evidence of acute diverticulitis. Electronically Signed   By: Jaime  Wagner D.O.   On: 06/29/2018 20:28    Review of Systems  Constitutional: Negative for weight loss.  HENT:  Negative for ear discharge, ear pain, hearing loss and tinnitus.   Eyes: Negative for blurred vision, double vision, photophobia and pain.  Respiratory: Negative for cough, sputum production and shortness of breath.   Cardiovascular: Negative for chest pain.  Gastrointestinal: Negative for abdominal pain, nausea and vomiting.  Genitourinary: Negative for dysuria, flank pain, frequency and urgency.  Musculoskeletal: Positive for joint pain (Right wrist). Negative for back pain, falls, myalgias and neck pain.  Neurological: Positive for loss of consciousness. Negative for dizziness, tingling, sensory change, focal weakness and headaches.  Endo/Heme/Allergies: Does not bruise/bleed easily.  Psychiatric/Behavioral: Negative for depression, memory loss and substance abuse. The patient is not nervous/anxious.    Blood pressure 127/75, pulse (!) 55, temperature 98 F (36.7 C), temperature source Oral, resp. rate 16, last menstrual period 06/21/2018, SpO2 100 %. Physical Exam  Constitutional: She appears well-developed and well-nourished. No distress.  HENT:  Head: Normocephalic and atraumatic.  Eyes: Conjunctivae are normal. Right eye exhibits no discharge. Left eye exhibits no discharge. No scleral icterus.  Neck: Normal range of motion.  Cardiovascular: Normal rate and regular rhythm.  Respiratory: Effort normal. No respiratory distress.  Musculoskeletal:  Right shoulder, elbow, wrist, digits- no skin wounds, severe TTP, in removable wrist splint, no instability, no blocks to motion proximal to wrist  Sens  Ax intact, R/M/U paresthetic  Mot   Ax/ R/ PIN/ M/ AIN/ U intact  Rad 2+  Neurological: She is alert.  Skin: Skin is warm and dry. She is not diaphoretic.  Psychiatric: She has a normal mood and affect. Her behavior is normal.      Assessment/Plan: Right wrist fx -- Fortunately there is no significant displacement and has a good chance to heal without surgical intervention. Recommend  wrist splint 24/7 for now, ice for next 48h, and f/u with her established orthopedic surgeon though she may f/u with Dr. Caralyn Guile if she desires. NWB to RUE. Her numbness may be normal for her as she notes that it often happens when she's lying in that position 2/2 s/p axillary dissection but also may stem from carpal swelling; I reassured her this would resolve with time.    Lisette Abu, PA-C Orthopedic Surgery [phone] 06/30/2018, 10:06 AM

## 2018-06-30 NOTE — Progress Notes (Signed)
*  Preliminary Results* Carotid artery duplex has been completed. Bilateral internal carotid arteries are 1-39%.  Vertebral arteries are patent with antegrade flow.  06/30/2018 2:41 PM  Andrea Carrillo Dawna Part

## 2018-06-30 NOTE — Progress Notes (Signed)
2D echo showed normal LVF with EF 55-60% with mildly dilated RA and RV but normal RVF.  No further inpt workup needed.  Will set up with outpt event monitor and cardiac MRI due to RVE.  Syncope most consistent with vasovagal syncope given sx.    [facility] will sign off.   Medication Recommendations:  none Other recommendations (labs, testing, etc):  Event monitor for 30 days and cardiac MRI to rule out RV dysplasia Follow up as an outpatient:  Followup with Dr. Radford Pax or extender after MRI and event monitor.

## 2018-06-30 NOTE — Progress Notes (Signed)
  Echocardiogram 2D Echocardiogram has been performed.  Andrea Carrillo 06/30/2018, 3:39 PM

## 2018-06-30 NOTE — Progress Notes (Signed)
Pt admitted to [facility] from ED, tele box # 08 hooked up, pt in sinus brady at rate of 54.  Assisted to the BR and pt voided.  Pt has a history of right breast cancer with lymph node dissection and is concerned about her injury right arm.  Right arm in a sling, fingers warm to touch and pt is able to move them.  Pt also c/o a pain in the left flank area, did not note any bruising there.  No skin issues noted.

## 2018-06-30 NOTE — Plan of Care (Signed)

## 2018-06-30 NOTE — Consult Note (Addendum)
Cardiology Consultation:   Patient ID: Andrea Carrillo; [id number]; June 19, 1961   Admit date: 06/29/2018 Date of Consult: 06/30/2018  Primary Care Provider: Medicine, [facility] Cardiologist: New   Patient Profile:   Andrea Carrillo is a 57 y.o. female with a hx of HER-2 positive breast cancer s/p double mastectomy, 13 rounds of radiation + chemo , diverticulosis, Crohn's disease, hypothyroidism, obesity s/p lap band surgery, ventral hernia, severe osteoporosis and knee OA, but no prior cardiac history and no significant cardiac risk factors, who is being seen today for the evaluation of syncope at the request of Dr. Grandville Silos, Internal Medicine.  History of Present Illness:   Ms. Coover was at [facility] when she has sudden syncope and collapse on 06/29/18 in the afternoon. She was there getting coffee and had had lunch prior that day. Per pt report, she had no prodromal symptoms, denying CP, palpitations, dyspnea, dizziness, lightheadedness, syncope or near syncope before hand, although she notes feeling dizzy when she awoke. Bystanders noted that she did not trip, but just collapsed suddenly and had LOC. When she awoke, she was on the floor and noted right wrist pain.  Pt came to the [facility] ED for evaluation. Head CT negative for acute intercranial abnormality. CT of the cervical spine negative. No acute CT finding of the chest, abdomen, pelvis, although a left thyroid goiter was noted as well as cholelithiasis (no evidence of acute cholecystitis) and low density, non enhancing lesions of the liver, most likely benign biliary cysts, 3 wall ventral hernias and diverticular disease w/o evidence of acute diverticulitis. No PE or coronary artery calcifications. CXR also negative. Radiograph of right forearm showed distal radius and ulnar styloid fractures. Per ortho, fortunately there is no significant displacement and has a good chance to heal without surgical intervention. They  recommend wrist splint 24/7 for now, ice and outpatient orthopedic f/u.   Initial labs also unremarkable. No hypoglycemia. BG was elevated at 153. Renal and hepatic function WNL. CBC negative. No anemia. Hgb 13. TSH WNL however Free T4 low at 0.67. Rapid UDS and UA pending. Cardiac enzymes are negative x 3. 2D Echo has been ordered as well as carotid dopplers, pending. EKGs have shown sinus bradycardia with rates in the upper 40s-mid 50s, which pt has a h/o (former marathon runner). Orthostatic VS negative (see below).  She is currently A&Ox3. She denies recurrent syncope/ near syncope. Telemetry reviewed and shows sinus bradycardia with HR in the 50s. Lowest HR was 43 bpm. No other arrthymias. She states this has never happened to her before. She denies any FH of SCD. Her mother had atrial fibrillation but no known CAD. Pt denies any recent history of exertional CP or dyspnea. She lives in a 2 story home and goes up and down the stairs each day without limitation. 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   Pt notes that since undergoing lab band surgery 2 years ago, she has had issues with PO intake and dehydration, however she can usually tell when she gets dehydrated, however she did not have those typical symptoms yesterday around the time of her syncopal spell.   Orthostatic VS for the past 24 hrs:  BP- Sitting Pulse- Sitting BP- Standing at 0 minutes Pulse- Standing at 0 minutes  06/30/18 0700 125/77 58 130/87 50     Past Medical History:  Diagnosis Date  . Acid reflux   . Arthritis   . Asthma   .  Attention deficit   . Hypothyroidism 06/30/2018  . Osteoporosis     History reviewed. No pertinent surgical history.   Home Medications:  Prior to Admission medications   Medication Sig Start Date End Date Taking? Authorizing Provider  letrozole (FEMARA) 2.5 MG tablet Take 2.5 mg by mouth every evening.  11/04/17  Yes [provider]    levothyroxine (SYNTHROID, LEVOTHROID) 25 MCG tablet Take 25 mcg by mouth every evening.    Yes [provider]  omeprazole (PRILOSEC) 20 MG capsule Take 20 mg by mouth every evening. 05/03/14  Yes [provider]    Inpatient Medications: Scheduled Meds: . enoxaparin (LOVENOX) injection  40 mg Subcutaneous Q24H  . letrozole  2.5 mg Oral QPM  . levothyroxine  25 mcg Oral QHS  . pantoprazole  40 mg Oral Daily  . sodium chloride flush  3 mL Intravenous Q12H   Continuous Infusions:  PRN Meds: acetaminophen **OR** acetaminophen, ketorolac, ondansetron **OR** ondansetron (ZOFRAN) IV, oxyCODONE, polyethylene glycol  Allergies:    Allergies  Allergen Reactions  . Latex Hives and Itching    Social History:   Social History   Socioeconomic History  . Marital status: Married    Spouse name: Not on file  . Number of children: Not on file  . Years of education: Not on file  . Highest education level: Not on file  Occupational History  . Not on file  Social Needs  . Financial resource strain: Not on file  . Food insecurity:    Worry: Not on file    Inability: Not on file  . Transportation needs:    Medical: Not on file    Non-medical: Not on file  Tobacco Use  . Smoking status: Never Smoker  . Smokeless tobacco: Never Used  Substance and Sexual Activity  . Alcohol use: No  . Drug use: No  . Sexual activity: Not on file  Lifestyle  . Physical activity:    Days per week: Not on file    Minutes per session: Not on file  . Stress: Not on file  Relationships  . Social connections:    Talks on phone: Not on file    Gets together: Not on file    Attends religious service: Not on file    Active member of club or organization: Not on file    Attends meetings of clubs or organizations: Not on file    Relationship status: Not on file  . Intimate partner violence:    Fear of current or ex partner: Not on file    Emotionally abused: Not on file    Physically  abused: Not on file    Forced sexual activity: Not on file  Other Topics Concern  . Not on file  Social History Narrative  . Not on file    Family History:    Family History  Problem Relation Age of Onset  . Thyroid cancer Mother   . Hypertension Mother   . Diabetes Other   . CAD Neg Hx   . Stroke Neg Hx      ROS:  Please see the history of present illness.   All other ROS reviewed and negative.     Physical Exam/Data:   Vitals:   06/30/18 0235 06/30/18 0519 06/30/18 0700 06/30/18 1235  BP: 104/64 94/69 127/75 102/61  Pulse: (!) 44 (!) 44 (!) 55 (!) 53  Resp: 16 16    Temp: 98.1 F (36.7 C) 97.7 F (36.5 C) 98  F (36.7 C) 97.7 F (36.5 C)  TempSrc: Oral Oral Oral Oral  SpO2: 98% 99% 100% 96%    Intake/Output Summary (Last 24 hours) at 06/30/2018 1238 Last data filed at 06/29/2018 1916 Gross per 24 hour  Intake 1000 ml  Output -  Net 1000 ml   There were no vitals filed for this visit. There is no height or weight on file to calculate BMI.  General:  Well nourished, well developed, in no acute distress HEENT: normal Lymph: no adenopathy Neck: no JVD Endocrine:  No thryomegaly Vascular: No carotid bruits; FA pulses 2+ bilaterally without bruits  Cardiac:  Sinus bradycardia,  no murmur  Lungs:  clear to auscultation bilaterally, no wheezing, rhonchi or rales  Abd: soft, nontender, no hepatomegaly  Ext: no edema Musculoskeletal:  Right wrist in splint for fx, otherwise no other abnormalities. Skin: warm and dry  Neuro:  CNs 2-12 intact, no focal abnormalities noted Psych:  Normal affect   EKG:  The EKG was personally reviewed and demonstrates:  Sinus bradycardia, upper 40s-mid 50s.   Telemetry:  Telemetry was personally reviewed and demonstrates:  Sinus brady, currently in the 50s, lowest rate in the low 40s. No other arrhythmias.   Relevant CV Studies: 2D Echo pending   Laboratory Data:  Chemistry Recent Labs  Lab 06/29/18 1802 06/30/18 0406  NA  141 140  K 4.4 4.3  CL 106 104  CO2 25 26  GLUCOSE 153* 100*  BUN 15 11  CREATININE 0.69 0.57  CALCIUM 9.2 8.7*  GFRNONAA >60 >60  GFRAA >60 >60  ANIONGAP 10 10    Recent Labs  Lab 06/29/18 1802 06/30/18 0406  PROT 6.5 5.9*  ALBUMIN 3.7 3.3*  AST 21 15  ALT 15 15  ALKPHOS 67 56  BILITOT 0.7 0.7   Hematology Recent Labs  Lab 06/29/18 1802 06/30/18 0406  WBC 7.4 7.1  RBC 4.76 4.45  HGB 13.6 12.7  HCT 43.4 40.4  MCV 91.2 90.8  MCH 28.6 28.5  MCHC 31.3 31.4  RDW 13.5 13.7  PLT 215 215   Cardiac Enzymes Recent Labs  Lab 06/29/18 2226 06/30/18 0406 06/30/18 0950  TROPONINI <0.03 <0.03 <0.03    Recent Labs  Lab 06/29/18 1816  TROPIPOC 0.00    BNPNo results for input(s): BNP, PROBNP in the last 168 hours.  DDimer No results for input(s): DDIMER in the last 168 hours.  Radiology/Studies:  Dg Chest 2 View  Result Date: 06/29/2018 CLINICAL DATA:  Syncopal episode resulting in a fall today. EXAM: CHEST - 2 VIEW COMPARISON:  None. FINDINGS: Normal sized heart. Clear lungs. Mild central peribronchial thickening. Bilateral axillary surgical clips. No fracture or pneumothorax. Normal vascularity. IMPRESSION: No acute abnormality.  Mild chronic bronchitic changes. Electronically Signed   By: Claudie Revering M.D.   On: 06/29/2018 19:47   Dg Forearm Right  Result Date: 06/29/2018 CLINICAL DATA:  Distal right forearm and wrist pain following a fall today. EXAM: RIGHT FOREARM - 2 VIEW COMPARISON:  None. FINDINGS: Mildly comminuted, impacted fracture of the distal radial metaphysis with a suggestion of mild dorsal displacement and angulation of the distal fragment on an oblique lateral view. There is no true lateral view available for accurate assessment. Also demonstrated is a nondisplaced fracture through the base of the ulnar styloid. IMPRESSION: Distal radius and ulnar styloid fractures, as described above. A true lateral view of the wrist would be necessary to accurately assess  alignment. Electronically Signed   By: Remo Lipps  Joneen Caraway M.D.   On: 06/29/2018 19:45   Ct Head Wo Contrast  Result Date: 06/29/2018 CLINICAL DATA:  57 year old female with a history of syncope EXAM: CT HEAD WITHOUT CONTRAST CT CERVICAL SPINE WITHOUT CONTRAST TECHNIQUE: Multidetector CT imaging of the head and cervical spine was performed following the standard protocol without intravenous contrast. Multiplanar CT image reconstructions of the cervical spine were also generated. COMPARISON:  None. FINDINGS: CT HEAD FINDINGS Brain: No acute intracranial hemorrhage. No midline shift or mass effect. Gray-white differentiation maintained. Unremarkable appearance of the ventricular system. Vascular: Unremarkable. Skull: No acute fracture.  No aggressive bone lesion identified. Sinuses/Orbits: Unremarkable appearance of the orbits. Mastoid air cells clear. No middle ear effusion. No significant sinus disease. Other: None CT CERVICAL SPINE FINDINGS Alignment: Craniocervical junction aligned. Anatomic alignment of the cervical elements, with straightening of the normal cervical lordosis, likely positional. No subluxation. Skull base and vertebrae: No acute fracture at the skullbase. Vertebral body heights relatively maintained. No acute fracture identified. Soft tissues and spinal canal: Unremarkable cervical soft tissues. Lymph nodes are present, though not enlarged. Disc levels: Disc space narrowing with early endplate changes. No significant canal narrowing or significant foraminal narrowing. Upper chest: Unremarkable appearance of the lung apices. Enlargement of the left thyroid. Other: No bony canal narrowing. IMPRESSION: Head CT: Negative for acute intracranial abnormality. Cervical CT: No acute fracture or malalignment of the cervical spine. Left thyroid goiter.  Endocrine evaluation may be warranted. Electronically Signed   By: Corrie Mckusick D.O.   On: 06/29/2018 20:17   Ct Angio Chest Pe W And/or Wo  Contrast  Result Date: 06/29/2018 CLINICAL DATA:  57 year old female with a history of syncope. Left-sided chest pain EXAM: CT ANGIOGRAPHY CHEST CT ABDOMEN AND PELVIS WITH CONTRAST TECHNIQUE: Multidetector CT imaging of the chest was performed using the standard protocol during bolus administration of intravenous contrast. Multiplanar CT image reconstructions and MIPs were obtained to evaluate the vascular anatomy. Multidetector CT imaging of the abdomen and pelvis was performed using the standard protocol during bolus administration of intravenous contrast. CONTRAST:  127m ISOVUE-370 IOPAMIDOL (ISOVUE-370) INJECTION 76% COMPARISON:  None. FINDINGS: CTA CHEST FINDINGS Cardiovascular: Heart: No cardiomegaly. No pericardial fluid/thickening. No significant coronary calcifications. Aorta: Unremarkable course, caliber, contour of the thoracic aorta. No aneurysm or dissection flap. No periaortic fluid. Pulmonary arteries: No central, lobar, segmental, or proximal subsegmental filling defects. Mediastinum/Nodes: Rightward deviation of the trachea secondary to enlargement of the left thyroid. Greatest diameter of the left thyroid tissue measures approximately 4.4 cm on the axial images. Unremarkable course of the thoracic esophagus. No lymphadenopathy. Lungs/Pleura: Central airways are clear. No pleural effusion. No confluent airspace disease. No pneumothorax. Musculoskeletal: No acute displaced fracture. Degenerative changes of the thoracic spine. Review of the MIP images confirms the above findings. CT ABDOMEN and PELVIS FINDINGS Hepatobiliary: There are a few small hypodense lesions of liver parenchyma. The largest measures 17 mm in segment 6. Overall, features are most compatible with benign cystic lesions. Calcified cholelithiasis without associated inflammation. Pancreas: Unremarkable pancreas Spleen: Unremarkable spleen Adrenals/Urinary Tract: Unremarkable appearance of the adrenal glands. No evidence of  hydronephrosis of the right or left kidney. No nephrolithiasis. Unremarkable course of the bilateral ureters. Unremarkable appearance of the urinary bladder. Stomach/Bowel: Surgical changes of the stomach. Small bowel unremarkable with no abnormal distention. No transition point. Laxity of the ventral abdominal wall, with focal hernia on the patient's right lower abdominal wall containing small bowel loops. No transition point. There is a smaller hernia sac in  the supraumbilical region containing transverse colon with no transition point. Normal appendix. Colonic diverticula without associated inflammation. Vascular/Lymphatic: No significant atherosclerosis. Mesenteric, renal, bilateral lower extremity arteries are patent. No abdominal lymphadenopathy. No retroperitoneal or pelvic lymphadenopathy. Reproductive: Hysterectomy Other: Additional hernia of the left lower abdominal wall containing fat. Musculoskeletal: No displaced fracture. Degenerative changes of the thoracolumbar spine. Degenerative changes of the hips. Review of the MIP images confirms the above findings. IMPRESSION: No acute CT finding of the chest, abdomen, pelvis. Left thyroid goiter.  Endocrine evaluation may be considered. Cholelithiasis without evidence of acute cholecystitis. Low-density, nonenhancing lesions of the liver, most likely benign biliary cysts. Surgical changes of the stomach. There are 3 ventral wall hernias. Supraumbilical hernia contains transverse colon without obstruction. Right lower abdominal wall hernia contains small bowel loops, without obstruction. Left lower abdominal hernia contains mesenteric fat. Diverticular disease without evidence of acute diverticulitis. Electronically Signed   By: Corrie Mckusick D.O.   On: 06/29/2018 20:28   Ct Cervical Spine Wo Contrast  Result Date: 06/29/2018 CLINICAL DATA:  57 year old female with a history of syncope EXAM: CT HEAD WITHOUT CONTRAST CT CERVICAL SPINE WITHOUT CONTRAST  TECHNIQUE: Multidetector CT imaging of the head and cervical spine was performed following the standard protocol without intravenous contrast. Multiplanar CT image reconstructions of the cervical spine were also generated. COMPARISON:  None. FINDINGS: CT HEAD FINDINGS Brain: No acute intracranial hemorrhage. No midline shift or mass effect. Gray-white differentiation maintained. Unremarkable appearance of the ventricular system. Vascular: Unremarkable. Skull: No acute fracture.  No aggressive bone lesion identified. Sinuses/Orbits: Unremarkable appearance of the orbits. Mastoid air cells clear. No middle ear effusion. No significant sinus disease. Other: None CT CERVICAL SPINE FINDINGS Alignment: Craniocervical junction aligned. Anatomic alignment of the cervical elements, with straightening of the normal cervical lordosis, likely positional. No subluxation. Skull base and vertebrae: No acute fracture at the skullbase. Vertebral body heights relatively maintained. No acute fracture identified. Soft tissues and spinal canal: Unremarkable cervical soft tissues. Lymph nodes are present, though not enlarged. Disc levels: Disc space narrowing with early endplate changes. No significant canal narrowing or significant foraminal narrowing. Upper chest: Unremarkable appearance of the lung apices. Enlargement of the left thyroid. Other: No bony canal narrowing. IMPRESSION: Head CT: Negative for acute intracranial abnormality. Cervical CT: No acute fracture or malalignment of the cervical spine. Left thyroid goiter.  Endocrine evaluation may be warranted. Electronically Signed   By: Corrie Mckusick D.O.   On: 06/29/2018 20:17   Ct Abdomen Pelvis W Contrast  Result Date: 06/29/2018 CLINICAL DATA:  57 year old female with a history of syncope. Left-sided chest pain EXAM: CT ANGIOGRAPHY CHEST CT ABDOMEN AND PELVIS WITH CONTRAST TECHNIQUE: Multidetector CT imaging of the chest was performed using the standard protocol during  bolus administration of intravenous contrast. Multiplanar CT image reconstructions and MIPs were obtained to evaluate the vascular anatomy. Multidetector CT imaging of the abdomen and pelvis was performed using the standard protocol during bolus administration of intravenous contrast. CONTRAST:  159m ISOVUE-370 IOPAMIDOL (ISOVUE-370) INJECTION 76% COMPARISON:  None. FINDINGS: CTA CHEST FINDINGS Cardiovascular: Heart: No cardiomegaly. No pericardial fluid/thickening. No significant coronary calcifications. Aorta: Unremarkable course, caliber, contour of the thoracic aorta. No aneurysm or dissection flap. No periaortic fluid. Pulmonary arteries: No central, lobar, segmental, or proximal subsegmental filling defects. Mediastinum/Nodes: Rightward deviation of the trachea secondary to enlargement of the left thyroid. Greatest diameter of the left thyroid tissue measures approximately 4.4 cm on the axial images. Unremarkable course of the thoracic esophagus.  No lymphadenopathy. Lungs/Pleura: Central airways are clear. No pleural effusion. No confluent airspace disease. No pneumothorax. Musculoskeletal: No acute displaced fracture. Degenerative changes of the thoracic spine. Review of the MIP images confirms the above findings. CT ABDOMEN and PELVIS FINDINGS Hepatobiliary: There are a few small hypodense lesions of liver parenchyma. The largest measures 17 mm in segment 6. Overall, features are most compatible with benign cystic lesions. Calcified cholelithiasis without associated inflammation. Pancreas: Unremarkable pancreas Spleen: Unremarkable spleen Adrenals/Urinary Tract: Unremarkable appearance of the adrenal glands. No evidence of hydronephrosis of the right or left kidney. No nephrolithiasis. Unremarkable course of the bilateral ureters. Unremarkable appearance of the urinary bladder. Stomach/Bowel: Surgical changes of the stomach. Small bowel unremarkable with no abnormal distention. No transition point. Laxity  of the ventral abdominal wall, with focal hernia on the patient's right lower abdominal wall containing small bowel loops. No transition point. There is a smaller hernia sac in the supraumbilical region containing transverse colon with no transition point. Normal appendix. Colonic diverticula without associated inflammation. Vascular/Lymphatic: No significant atherosclerosis. Mesenteric, renal, bilateral lower extremity arteries are patent. No abdominal lymphadenopathy. No retroperitoneal or pelvic lymphadenopathy. Reproductive: Hysterectomy Other: Additional hernia of the left lower abdominal wall containing fat. Musculoskeletal: No displaced fracture. Degenerative changes of the thoracolumbar spine. Degenerative changes of the hips. Review of the MIP images confirms the above findings. IMPRESSION: No acute CT finding of the chest, abdomen, pelvis. Left thyroid goiter.  Endocrine evaluation may be considered. Cholelithiasis without evidence of acute cholecystitis. Low-density, nonenhancing lesions of the liver, most likely benign biliary cysts. Surgical changes of the stomach. There are 3 ventral wall hernias. Supraumbilical hernia contains transverse colon without obstruction. Right lower abdominal wall hernia contains small bowel loops, without obstruction. Left lower abdominal hernia contains mesenteric fat. Diverticular disease without evidence of acute diverticulitis. Electronically Signed   By: Corrie Mckusick D.O.   On: 06/29/2018 20:28    Assessment and Plan:   Andrea Carrillo is a 57 y.o. female with a hx of HER-2 positive breast cancer s/p double mastectomy, 13 rounds of radiation + chemo , diverticulosis, Crohn's disease, hypothyroidism, obesity s/p lap band surgery, ventral hernia, severe osteoporosis and knee OA, but no prior cardiac history and no significant cardiac risk factors, who is being seen today for the evaluation of syncope at the request of Dr. Grandville Silos, Internal Medicine.   1. Syncope:  unknown etiology. No prodrome. She denies associated palpitations, CP, dizziness and dyspnea.  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 SCr and BUN do no suggest dehydration. 2D Echo and carotid dopplers pending. Pt denies any recurrent syncope or near syncope. If echo and carotid dopplers return negative, we can arrange outpatient 30 day monitor for further surveillance. MD to follow with further recommendations.   2. Rt Wrist Fracture: secondary to fall and severe osteoporosis (recent spontaneous rib fracture w/ bone scan showing severe osteoporosis>>per pt report severe osteoporosis is felt secondary to prior breast cancer treatment therapies). Radiographs of right forearm confirmed distal radius and ulnar styloid fractures. Per ortho, fortunately there is no significant displacement and has a good chance to heal without surgical intervention. They recommend wrist splint 24/7 for now, ice and  outpatient orthopedic f/u.   3. Thyroid: CT of neck showed a left thyroid goiter. TSH WNL however Free T4 is low 0.67, Free T3 level still pending. She is currently on levothyroxine. Management per IM.     For questions or updates, please contact [facility] Please consult [email or web address] for contact info under Cardiology/STEMI.   Signed, Lyda Jester, PA-C  06/30/2018 12:38 PM

## 2018-06-30 NOTE — Progress Notes (Signed)
Order for 30 day monitor has been placed. Our office will call with appt for pick-up.

## 2018-06-30 NOTE — Progress Notes (Signed)
PROGRESS NOTE    Andrea Carrillo  PJA:[id number] DOB: [birth year] DOA: 06/29/2018 PCP: Medicine, [facility]    Brief Narrative:  Patient is a 57 year old female history of breast cancer status post total mastectomy, history of diverticulitis, history of ventral hernias, history of osteoporosis and osteoarthritis presented to the ED after syncopal episode obtained while at [employer] where patient noted to have passed out.  Patient with a prior history of spontaneous rib fractures a few months ago.  Patient's mobility limited by bilateral knee arthritis and history of breast cancer with intermittent lightheadedness.  Plain films that were done of patient's right forearm was consistent with a comminuted, impacted fracture of the distal radial metaphysis.  Splint was placed and ED doctor spoke with orthopedics and was recommended outpatient follow-up.  Patient was admitted for syncopal work-up.  Patient also noted to have bradycardia with heart rates in the 40s.   Assessment & Plan:   Principal Problem:   Syncope Active Problems:   Breast cancer (HCC)   Thyroid goiter   Other fractures of lower end of right radius, initial encounter for closed fracture   Osteoporosis   Hip pain, acute, left   Hypothyroidism  1 syncope Questionable etiology.  Patient stated was at [facility] putting sugar in her coffee when she passed out.  Syncopal episode was sudden onset with no prodromal symptoms noted.  Cardiac enzymes cycled have been negative x2.  Patient noted to have a bradycardia with heart rates in the 40s however denies any lightheadedness or dizziness.  Orthostatics were not checked on admission however checked this morning and were negative.  CT head negative.  2D echo pending.  Carotid Dopplers pending.  Cardiology consultation pending for further evaluation and management.  2.  Fracture of distal right radial metaphysis Per admission [facility]&P orthopedics, Dr. Apolonio Schneiders was spoken to who  had recommended splint placement with outpatient follow-up.  Patient however states has a history of breast cancer with osteoporosis and is on Fosamax and is a little concerned about this fracture.  Will consult with orthopedics for formal consultation this morning.  Resume home dose of Fosamax.  Continue splints.  Follow.  3.  History of hypothyroidism/thyroid goiter Continue home regimen Synthroid.  Outpatient follow-up with her endocrinologist.  4.  History of breast cancer status post mastectomy Outpatient follow-up with oncology.  5.  Left hip pain Check plain films of the left hip to rule out fracture.  Pain management.  6.  Osteoporosis Resume home dose of Fosamax.    DVT prophylaxis: Lovenox Code Status: Full Family Communication: Updated patient and husband at bedside. Disposition Plan: Home once work-up is completed.   Consultants:   Cardiology pending  Hand/orthopedics pending  Procedures:   Plain films of the right forearm 06/29/2018  Chest x-ray 06/29/2018  CT head 06/29/2018  CT angiogram chest 06/29/2018  CT C-spine 06/29/2018  CT abdomen and pelvis 06/29/2018  2D echo pending  Carotid ultrasounds pending  Antimicrobials:  None   Subjective: Patient sitting on the side of the bed.  Patient frustrated that she has not been updated since admission about what is going on with her hospitalization.  Patient denies any chest pain no shortness of breath.  Patient complained of left hip pain.  Patient also complained of right forearm pain.  Patient denies any dizziness.  No chest pain.  No shortness of breath.  Patient states she is feeling better than she did on admission.  Objective: Vitals:   06/29/18 2351 06/30/18  0235 06/30/18 0519 06/30/18 0700  BP: 115/75 104/64 94/69 127/75  Pulse: (!) 50 (!) 44 (!) 44 (!) 55  Resp: 14 16 16    Temp: 98 F (36.7 C) 98.1 F (36.7 C) 97.7 F (36.5 C) 98 F (36.7 C)  TempSrc: Oral Oral Oral Oral  SpO2: 99% 98% 99% 100%     Intake/Output Summary (Last 24 hours) at 06/30/2018 9622 Last data filed at 06/29/2018 1916 Gross per 24 hour  Intake 1000 ml  Output -  Net 1000 ml   There were no vitals filed for this visit.  Examination:  General exam: Appears calm and comfortable  Respiratory system: Clear to auscultation. Respiratory effort normal. Cardiovascular system: S1 & S2 heard, RRR. No JVD, murmurs, rubs, gallops or clicks. No pedal edema. Gastrointestinal system: Abdomen is nondistended, soft and nontender. No organomegaly or masses felt. Normal bowel sounds heard. Central nervous system: Alert and oriented. No focal neurological deficits. Extremities: Symmetric 5 x 5 power.  Right forearm in the splint. Skin: No rashes, lesions or ulcers Psychiatry: Judgement and insight appear normal. Mood & affect appropriate.  Musculoskeletal: Left iliac tender to palpation.    Data Reviewed: I have personally reviewed following labs and imaging studies  CBC: Recent Labs  Lab 06/29/18 1802 06/30/18 0406  WBC 7.4 7.1  NEUTROABS 5.4  --   HGB 13.6 12.7  HCT 43.4 40.4  MCV 91.2 90.8  PLT 215 297   Basic Metabolic Panel: Recent Labs  Lab 06/29/18 1802 06/30/18 0406  NA 141 140  K 4.4 4.3  CL 106 104  CO2 25 26  GLUCOSE 153* 100*  BUN 15 11  CREATININE 0.69 0.57  CALCIUM 9.2 8.7*   GFR: CrCl cannot be calculated (Unknown ideal weight.). Liver Function Tests: Recent Labs  Lab 06/29/18 1802 06/30/18 0406  AST 21 15  ALT 15 15  ALKPHOS 67 56  BILITOT 0.7 0.7  PROT 6.5 5.9*  ALBUMIN 3.7 3.3*   No results for input(s): LIPASE, AMYLASE in the last 168 hours. No results for input(s): AMMONIA in the last 168 hours. Coagulation Profile: No results for input(s): INR, PROTIME in the last 168 hours. Cardiac Enzymes: Recent Labs  Lab 06/29/18 2226 06/30/18 0406  TROPONINI <0.03 <0.03   BNP (last 3 results) No results for input(s): PROBNP in the last 8760 hours. HbA1C: Recent Labs     06/30/18 0406  HGBA1C 5.3   CBG: No results for input(s): GLUCAP in the last 168 hours. Lipid Profile: No results for input(s): CHOL, HDL, LDLCALC, TRIG, CHOLHDL, LDLDIRECT in the last 72 hours. Thyroid Function Tests: Recent Labs    06/29/18 2226  TSH 1.116  FREET4 0.67*   Anemia Panel: No results for input(s): VITAMINB12, FOLATE, FERRITIN, TIBC, IRON, RETICCTPCT in the last 72 hours. Sepsis Labs: No results for input(s): PROCALCITON, LATICACIDVEN in the last 168 hours.  No results found for this or any previous visit (from the past 240 hour(s)).       Radiology Studies: Dg Chest 2 View  Result Date: 06/29/2018 CLINICAL DATA:  Syncopal episode resulting in a fall today. EXAM: CHEST - 2 VIEW COMPARISON:  None. FINDINGS: Normal sized heart. Clear lungs. Mild central peribronchial thickening. Bilateral axillary surgical clips. No fracture or pneumothorax. Normal vascularity. IMPRESSION: No acute abnormality.  Mild chronic bronchitic changes. Electronically Signed   By: Claudie Revering M.D.   On: 06/29/2018 19:47   Dg Forearm Right  Result Date: 06/29/2018 CLINICAL DATA:  Distal right forearm  and wrist pain following a fall today. EXAM: RIGHT FOREARM - 2 VIEW COMPARISON:  None. FINDINGS: Mildly comminuted, impacted fracture of the distal radial metaphysis with a suggestion of mild dorsal displacement and angulation of the distal fragment on an oblique lateral view. There is no true lateral view available for accurate assessment. Also demonstrated is a nondisplaced fracture through the base of the ulnar styloid. IMPRESSION: Distal radius and ulnar styloid fractures, as described above. A true lateral view of the wrist would be necessary to accurately assess alignment. Electronically Signed   By: Claudie Revering M.D.   On: 06/29/2018 19:45   Ct Head Wo Contrast  Result Date: 06/29/2018 CLINICAL DATA:  57 year old female with a history of syncope EXAM: CT HEAD WITHOUT CONTRAST CT CERVICAL SPINE  WITHOUT CONTRAST TECHNIQUE: Multidetector CT imaging of the head and cervical spine was performed following the standard protocol without intravenous contrast. Multiplanar CT image reconstructions of the cervical spine were also generated. COMPARISON:  None. FINDINGS: CT HEAD FINDINGS Brain: No acute intracranial hemorrhage. No midline shift or mass effect. Gray-white differentiation maintained. Unremarkable appearance of the ventricular system. Vascular: Unremarkable. Skull: No acute fracture.  No aggressive bone lesion identified. Sinuses/Orbits: Unremarkable appearance of the orbits. Mastoid air cells clear. No middle ear effusion. No significant sinus disease. Other: None CT CERVICAL SPINE FINDINGS Alignment: Craniocervical junction aligned. Anatomic alignment of the cervical elements, with straightening of the normal cervical lordosis, likely positional. No subluxation. Skull base and vertebrae: No acute fracture at the skullbase. Vertebral body heights relatively maintained. No acute fracture identified. Soft tissues and spinal canal: Unremarkable cervical soft tissues. Lymph nodes are present, though not enlarged. Disc levels: Disc space narrowing with early endplate changes. No significant canal narrowing or significant foraminal narrowing. Upper chest: Unremarkable appearance of the lung apices. Enlargement of the left thyroid. Other: No bony canal narrowing. IMPRESSION: Head CT: Negative for acute intracranial abnormality. Cervical CT: No acute fracture or malalignment of the cervical spine. Left thyroid goiter.  Endocrine evaluation may be warranted. Electronically Signed   By: Corrie Mckusick D.O.   On: 06/29/2018 20:17   Ct Angio Chest Pe W And/or Wo Contrast  Result Date: 06/29/2018 CLINICAL DATA:  57 year old female with a history of syncope. Left-sided chest pain EXAM: CT ANGIOGRAPHY CHEST CT ABDOMEN AND PELVIS WITH CONTRAST TECHNIQUE: Multidetector CT imaging of the chest was performed using the  standard protocol during bolus administration of intravenous contrast. Multiplanar CT image reconstructions and MIPs were obtained to evaluate the vascular anatomy. Multidetector CT imaging of the abdomen and pelvis was performed using the standard protocol during bolus administration of intravenous contrast. CONTRAST:  120mL ISOVUE-370 IOPAMIDOL (ISOVUE-370) INJECTION 76% COMPARISON:  None. FINDINGS: CTA CHEST FINDINGS Cardiovascular: Heart: No cardiomegaly. No pericardial fluid/thickening. No significant coronary calcifications. Aorta: Unremarkable course, caliber, contour of the thoracic aorta. No aneurysm or dissection flap. No periaortic fluid. Pulmonary arteries: No central, lobar, segmental, or proximal subsegmental filling defects. Mediastinum/Nodes: Rightward deviation of the trachea secondary to enlargement of the left thyroid. Greatest diameter of the left thyroid tissue measures approximately 4.4 cm on the axial images. Unremarkable course of the thoracic esophagus. No lymphadenopathy. Lungs/Pleura: Central airways are clear. No pleural effusion. No confluent airspace disease. No pneumothorax. Musculoskeletal: No acute displaced fracture. Degenerative changes of the thoracic spine. Review of the MIP images confirms the above findings. CT ABDOMEN and PELVIS FINDINGS Hepatobiliary: There are a few small hypodense lesions of liver parenchyma. The largest measures 17 mm in segment 6.  Overall, features are most compatible with benign cystic lesions. Calcified cholelithiasis without associated inflammation. Pancreas: Unremarkable pancreas Spleen: Unremarkable spleen Adrenals/Urinary Tract: Unremarkable appearance of the adrenal glands. No evidence of hydronephrosis of the right or left kidney. No nephrolithiasis. Unremarkable course of the bilateral ureters. Unremarkable appearance of the urinary bladder. Stomach/Bowel: Surgical changes of the stomach. Small bowel unremarkable with no abnormal distention. No  transition point. Laxity of the ventral abdominal wall, with focal hernia on the patient's right lower abdominal wall containing small bowel loops. No transition point. There is a smaller hernia sac in the supraumbilical region containing transverse colon with no transition point. Normal appendix. Colonic diverticula without associated inflammation. Vascular/Lymphatic: No significant atherosclerosis. Mesenteric, renal, bilateral lower extremity arteries are patent. No abdominal lymphadenopathy. No retroperitoneal or pelvic lymphadenopathy. Reproductive: Hysterectomy Other: Additional hernia of the left lower abdominal wall containing fat. Musculoskeletal: No displaced fracture. Degenerative changes of the thoracolumbar spine. Degenerative changes of the hips. Review of the MIP images confirms the above findings. IMPRESSION: No acute CT finding of the chest, abdomen, pelvis. Left thyroid goiter.  Endocrine evaluation may be considered. Cholelithiasis without evidence of acute cholecystitis. Low-density, nonenhancing lesions of the liver, most likely benign biliary cysts. Surgical changes of the stomach. There are 3 ventral wall hernias. Supraumbilical hernia contains transverse colon without obstruction. Right lower abdominal wall hernia contains small bowel loops, without obstruction. Left lower abdominal hernia contains mesenteric fat. Diverticular disease without evidence of acute diverticulitis. Electronically Signed   By: Corrie Mckusick D.O.   On: 06/29/2018 20:28   Ct Cervical Spine Wo Contrast  Result Date: 06/29/2018 CLINICAL DATA:  57 year old female with a history of syncope EXAM: CT HEAD WITHOUT CONTRAST CT CERVICAL SPINE WITHOUT CONTRAST TECHNIQUE: Multidetector CT imaging of the head and cervical spine was performed following the standard protocol without intravenous contrast. Multiplanar CT image reconstructions of the cervical spine were also generated. COMPARISON:  None. FINDINGS: CT HEAD FINDINGS  Brain: No acute intracranial hemorrhage. No midline shift or mass effect. Gray-white differentiation maintained. Unremarkable appearance of the ventricular system. Vascular: Unremarkable. Skull: No acute fracture.  No aggressive bone lesion identified. Sinuses/Orbits: Unremarkable appearance of the orbits. Mastoid air cells clear. No middle ear effusion. No significant sinus disease. Other: None CT CERVICAL SPINE FINDINGS Alignment: Craniocervical junction aligned. Anatomic alignment of the cervical elements, with straightening of the normal cervical lordosis, likely positional. No subluxation. Skull base and vertebrae: No acute fracture at the skullbase. Vertebral body heights relatively maintained. No acute fracture identified. Soft tissues and spinal canal: Unremarkable cervical soft tissues. Lymph nodes are present, though not enlarged. Disc levels: Disc space narrowing with early endplate changes. No significant canal narrowing or significant foraminal narrowing. Upper chest: Unremarkable appearance of the lung apices. Enlargement of the left thyroid. Other: No bony canal narrowing. IMPRESSION: Head CT: Negative for acute intracranial abnormality. Cervical CT: No acute fracture or malalignment of the cervical spine. Left thyroid goiter.  Endocrine evaluation may be warranted. Electronically Signed   By: Corrie Mckusick D.O.   On: 06/29/2018 20:17   Ct Abdomen Pelvis W Contrast  Result Date: 06/29/2018 CLINICAL DATA:  57 year old female with a history of syncope. Left-sided chest pain EXAM: CT ANGIOGRAPHY CHEST CT ABDOMEN AND PELVIS WITH CONTRAST TECHNIQUE: Multidetector CT imaging of the chest was performed using the standard protocol during bolus administration of intravenous contrast. Multiplanar CT image reconstructions and MIPs were obtained to evaluate the vascular anatomy. Multidetector CT imaging of the abdomen and pelvis was performed  using the standard protocol during bolus administration of  intravenous contrast. CONTRAST:  171mL ISOVUE-370 IOPAMIDOL (ISOVUE-370) INJECTION 76% COMPARISON:  None. FINDINGS: CTA CHEST FINDINGS Cardiovascular: Heart: No cardiomegaly. No pericardial fluid/thickening. No significant coronary calcifications. Aorta: Unremarkable course, caliber, contour of the thoracic aorta. No aneurysm or dissection flap. No periaortic fluid. Pulmonary arteries: No central, lobar, segmental, or proximal subsegmental filling defects. Mediastinum/Nodes: Rightward deviation of the trachea secondary to enlargement of the left thyroid. Greatest diameter of the left thyroid tissue measures approximately 4.4 cm on the axial images. Unremarkable course of the thoracic esophagus. No lymphadenopathy. Lungs/Pleura: Central airways are clear. No pleural effusion. No confluent airspace disease. No pneumothorax. Musculoskeletal: No acute displaced fracture. Degenerative changes of the thoracic spine. Review of the MIP images confirms the above findings. CT ABDOMEN and PELVIS FINDINGS Hepatobiliary: There are a few small hypodense lesions of liver parenchyma. The largest measures 17 mm in segment 6. Overall, features are most compatible with benign cystic lesions. Calcified cholelithiasis without associated inflammation. Pancreas: Unremarkable pancreas Spleen: Unremarkable spleen Adrenals/Urinary Tract: Unremarkable appearance of the adrenal glands. No evidence of hydronephrosis of the right or left kidney. No nephrolithiasis. Unremarkable course of the bilateral ureters. Unremarkable appearance of the urinary bladder. Stomach/Bowel: Surgical changes of the stomach. Small bowel unremarkable with no abnormal distention. No transition point. Laxity of the ventral abdominal wall, with focal hernia on the patient's right lower abdominal wall containing small bowel loops. No transition point. There is a smaller hernia sac in the supraumbilical region containing transverse colon with no transition point. Normal  appendix. Colonic diverticula without associated inflammation. Vascular/Lymphatic: No significant atherosclerosis. Mesenteric, renal, bilateral lower extremity arteries are patent. No abdominal lymphadenopathy. No retroperitoneal or pelvic lymphadenopathy. Reproductive: Hysterectomy Other: Additional hernia of the left lower abdominal wall containing fat. Musculoskeletal: No displaced fracture. Degenerative changes of the thoracolumbar spine. Degenerative changes of the hips. Review of the MIP images confirms the above findings. IMPRESSION: No acute CT finding of the chest, abdomen, pelvis. Left thyroid goiter.  Endocrine evaluation may be considered. Cholelithiasis without evidence of acute cholecystitis. Low-density, nonenhancing lesions of the liver, most likely benign biliary cysts. Surgical changes of the stomach. There are 3 ventral wall hernias. Supraumbilical hernia contains transverse colon without obstruction. Right lower abdominal wall hernia contains small bowel loops, without obstruction. Left lower abdominal hernia contains mesenteric fat. Diverticular disease without evidence of acute diverticulitis. Electronically Signed   By: Corrie Mckusick D.O.   On: 06/29/2018 20:28        Scheduled Meds: . alendronate  10 mg Oral QAC breakfast  . enoxaparin (LOVENOX) injection  40 mg Subcutaneous Q24H  . letrozole  2.5 mg Oral QPM  . levothyroxine  25 mcg Oral QPM  . pantoprazole  40 mg Oral Daily  . sodium chloride flush  3 mL Intravenous Q12H   Continuous Infusions:    LOS: 0 days    Time spent: 40 minutes    Irine Seal, MD [facility] Pager [phone]  If 7PM-7AM, please contact night-coverage [email or web address] Password [facility] 06/30/2018, 9:28 AM

## 2018-06-30 NOTE — Progress Notes (Addendum)
PHARMACIST - PHYSICIAN COMMUNICATION  CONCERNING: P&T Medication Policy Regarding Oral Bisphosphonates  RECOMMENDATION: Your order for alendronate (Fosamax), ibandronate (Boniva), or risedronate (Actonel) has been discontinued at this time.  If the patient's post-hospital medical condition warrants safe use of this class of drugs, please resume the pre-hospital regimen upon discharge.  DESCRIPTION:  Alendronate (Fosamax), ibandronate (Boniva), and risedronate (Actonel) can cause severe esophageal erosions in patients who are unable to remain upright at least 30 minutes after taking this medication.   Since brief interruptions in therapy are thought to have minimal impact on bone mineral density, the [facility] has established that bisphosphonate orders should be routinely discontinued during hospitalization.   To override this safety policy and permit administration of Boniva, Fosamax, or Actonel in the hospital, prescribers must write "DO NOT HOLD" in the comments section when placing the order for this class of medications.  Sherlon Handing, PharmD, BCPS Clinical pharmacist

## 2018-07-01 ENCOUNTER — Other Ambulatory Visit: Payer: Self-pay | Admitting: Cardiology

## 2018-07-01 ENCOUNTER — Telehealth: Payer: Self-pay | Admitting: Cardiology

## 2018-07-01 DIAGNOSIS — R55 Syncope and collapse: Secondary | ICD-10-CM

## 2018-07-01 DIAGNOSIS — M25552 Pain in left hip: Secondary | ICD-10-CM | POA: Diagnosis not present

## 2018-07-01 DIAGNOSIS — E039 Hypothyroidism, unspecified: Secondary | ICD-10-CM | POA: Diagnosis not present

## 2018-07-01 DIAGNOSIS — C50911 Malignant neoplasm of unspecified site of right female breast: Secondary | ICD-10-CM | POA: Diagnosis not present

## 2018-07-01 LAB — CBC
HEMATOCRIT: 38.7 % (ref 36.0–46.0)
HEMOGLOBIN: 11.9 g/dL — AB (ref 12.0–15.0)
MCH: 28.6 pg (ref 26.0–34.0)
MCHC: 30.7 g/dL (ref 30.0–36.0)
MCV: 93 fL (ref 78.0–100.0)
Platelets: 176 10*3/uL (ref 150–400)
RBC: 4.16 MIL/uL (ref 3.87–5.11)
RDW: 13.7 % (ref 11.5–15.5)
WBC: 5.1 10*3/uL (ref 4.0–10.5)

## 2018-07-01 LAB — BASIC METABOLIC PANEL
ANION GAP: 11 (ref 5–15)
BUN: 17 mg/dL (ref 6–20)
CO2: 22 mmol/L (ref 22–32)
Calcium: 8.4 mg/dL — ABNORMAL LOW (ref 8.9–10.3)
Chloride: 109 mmol/L (ref 98–111)
Creatinine, Ser: 0.7 mg/dL (ref 0.44–1.00)
GFR calc Af Amer: >60 mL/min (ref >60)
GFR calc non Af Amer: >60 mL/min (ref >60)
GLUCOSE: 94 mg/dL (ref 70–99)
POTASSIUM: 4.7 mmol/L (ref 3.5–5.1)
Sodium: 142 mmol/L (ref 135–145)

## 2018-07-01 LAB — T3: T3, Total: 109 ng/dL (ref 71–180)

## 2018-07-01 MED ORDER — ACETAMINOPHEN-CODEINE #3 300-30 MG PO TABS: 1.0000 | ORAL_TABLET | ORAL | 0 refills | Status: AC | PRN

## 2018-07-01 NOTE — Telephone Encounter (Signed)
Called patient and left a generic VM to call me back.

## 2018-07-01 NOTE — Discharge Summary (Signed)
Physician Discharge Summary  Kenlei Safi RSW:[id number] DOB: Jan 16, 1961 DOA: 06/29/2018  PCP: [facility], [facility]  Admit date: 06/29/2018 Discharge date: 07/01/2018  Time spent: 55 minutes  Recommendations for Outpatient Follow-up:  1. Follow-up with Dr. Fransico Him, cardiology.  Office will call with appointment time.  Office will call to set patient up with event monitor and cardiac MRI. 2. Follow-up with orthopedist in 1 week or as scheduled. 3. Follow-up with Medicine, [facility] as scheduled or 1 to 2 weeks.   Discharge Diagnoses:  Principal Problem:   Syncope Active Problems:   Breast cancer ([place])   Thyroid goiter   Other fractures of lower end of right radius, initial encounter for closed fracture   Osteoporosis   Hip pain, acute, left   Hypothyroidism   Syncope and collapse   Discharge Condition: Stable and improved  Diet recommendation: Regular  There were no vitals filed for this visit.  History of present illness:  Per Dr. Colbert Coyer Behrend is a 57 y.o. female with medical history significant of breast cancer status post total mastectomy history of diverticulitis history of ventral hernia, hx of osteoporosis, and osteoarthritis  Presented with  Fall while at [facility] bread patient fell down She was putting a creamer into her coffee and then end up on the floor. She did not think she tripped she thinks likely passed out.  She had a spontaneous rib fracture few months ago. Bone scan done recently showed severe osteoporosis.  Now her mobility is limited secondary to bilateral knee arthritis and history of breast cancer she has been having intermittent lightheadedness Is been seen for this by her oncologist in 2018  Patient has history of slow heart rate chronically And used to be a marathon runner. Had extensive cardiac workup in the past which was unremarkable at [facility] Course:  1 vasovagal  syncope Questionable etiology.  Patient stated was at [facility] putting sugar in her coffee when she passed out.  Syncopal episode was sudden onset with no prodromal symptoms noted.  Cardiac enzymes cycled have been negative x2.  Patient noted to have a bradycardia with heart rates in the 40s however denied any lightheadedness or dizziness.  Orthostatics were not checked on admission however checked during the hospitalization and were negative.  CT head which was done was negative.  Carotid Dopplers which were done had no significant stenosis.  2D echo was subsequently done with a normal left ventricle and ejection fraction of 55 to 60%, mildly dilated right atrium and right ventricle but normal right ventricular function.  Patient was seen in consultation by cardiology who recommended outpatient event monitor and cardiac MRI secondary to right ventricular enlargement.  It was felt that patient syncope was most consistent with a vasovagal syncopal event.  Patient had no further syncope during the hospitalization and will be discharged home in stable and improved condition.    2.  Fracture of distal right radial metaphysis Per admission [facility]&P orthopedics, Dr. Caralyn Guile was spoken to who had recommended splint placement with outpatient follow-up.  Patient however stated has a history of breast cancer with osteoporosis and is on Fosamax and is a little concerned about this fracture.  Orthopedics were formally consulted who came and assessed the patient and recommended continued splint, pain management with outpatient follow-up.  Patient will follow-up with her orthopedist post discharge for further management and recommendations.   3.  History of hypothyroidism/thyroid goiter Patient maintained on her home regimen of Synthroid.  Outpatient follow-up with her endocrinologist.    4.  History of breast cancer status post mastectomy Outpatient follow-up with oncology.  5.  Left hip pain Plain films of the left  hip and pelvis were obtained which were negative for fracture.  Patient's pain was managed.  Outpatient follow-up with PCP.    6.  Osteoporosis Resumed home dose of Fosamax on discharge.   Procedures:  Plain films of the right forearm 06/29/2018  Chest x-ray 06/29/2018  CT head 06/29/2018  CT angiogram chest 06/29/2018  CT C-spine 06/29/2018  CT abdomen and pelvis 06/29/2018  2D echo 06/30/2018  Carotid ultrasounds 06/30/2018  Plain films left hip and pelvis 06/30/2018  Consultations:  Cardiology: Dr. Fransico Him 06/30/2018  Hand/orthopedics Dr. Caralyn Guile 06/30/2018  Discharge Exam: Vitals:   07/01/18 0514 07/01/18 0900  BP: 116/76   Pulse: (!) 44 85  Resp: 18   Temp: 97.8 F (36.6 C)   SpO2: 100%     General: NAD Cardiovascular: RRR Respiratory: CTAB  Discharge Instructions   Discharge Instructions    Diet general   Complete by:  As directed    Increase activity slowly   Complete by:  As directed      Allergies as of 07/01/2018      Reactions   Latex Hives, Itching      Medication List    TAKE these medications   acetaminophen-codeine 300-30 MG tablet Commonly known as:  TYLENOL #3 Take 1-2 tablets by mouth every 4 (four) hours as needed for moderate pain.   letrozole 2.5 MG tablet Commonly known as:  FEMARA Take 2.5 mg by mouth every evening.   levothyroxine 25 MCG tablet Commonly known as:  SYNTHROID, LEVOTHROID Take 25 mcg by mouth every evening.   omeprazole 20 MG capsule Commonly known as:  PRILOSEC Take 20 mg by mouth every evening.      Allergies  Allergen Reactions  . Latex Hives and Itching   Follow-up Information    [facility], [facility]. Schedule an appointment as soon as possible for a visit in 2 week(s).   Specialty:  Pediatric Gastroenterology       orthopedics Follow up.   Why:  f/u as scheduled.       Sueanne Margarita, MD Follow up.   Specialty:  Cardiology Why:  office will call to set up cardiac MRI,  Holter monitor and outpatient follow up. Contact information: [address] [place] [address] [phone]            The results of significant diagnostics from this hospitalization (including imaging, microbiology, ancillary and laboratory) are listed below for reference.    Significant Diagnostic Studies: Dg Chest 2 View  Result Date: 06/29/2018 CLINICAL DATA:  Syncopal episode resulting in a fall today. EXAM: CHEST - 2 VIEW COMPARISON:  None. FINDINGS: Normal sized heart. Clear lungs. Mild central peribronchial thickening. Bilateral axillary surgical clips. No fracture or pneumothorax. Normal vascularity. IMPRESSION: No acute abnormality.  Mild chronic bronchitic changes. Electronically Signed   By: Claudie Revering M.D.   On: 06/29/2018 19:47   Dg Forearm Right  Result Date: 06/29/2018 CLINICAL DATA:  Distal right forearm and wrist pain following a fall today. EXAM: RIGHT FOREARM - 2 VIEW COMPARISON:  None. FINDINGS: Mildly comminuted, impacted fracture of the distal radial metaphysis with a suggestion of mild dorsal displacement and angulation of the distal fragment on an oblique lateral view. There is no true lateral view available for accurate assessment.  Also demonstrated is a nondisplaced fracture through the base of the ulnar styloid. IMPRESSION: Distal radius and ulnar styloid fractures, as described above. A true lateral view of the wrist would be necessary to accurately assess alignment. Electronically Signed   By: Claudie Revering M.D.   On: 06/29/2018 19:45   Ct Head Wo Contrast  Result Date: 06/29/2018 CLINICAL DATA:  57 year old female with a history of syncope EXAM: CT HEAD WITHOUT CONTRAST CT CERVICAL SPINE WITHOUT CONTRAST TECHNIQUE: Multidetector CT imaging of the head and cervical spine was performed following the standard protocol without intravenous contrast. Multiplanar CT image reconstructions of the cervical spine were also generated. COMPARISON:  None.  FINDINGS: CT HEAD FINDINGS Brain: No acute intracranial hemorrhage. No midline shift or mass effect. Gray-white differentiation maintained. Unremarkable appearance of the ventricular system. Vascular: Unremarkable. Skull: No acute fracture.  No aggressive bone lesion identified. Sinuses/Orbits: Unremarkable appearance of the orbits. Mastoid air cells clear. No middle ear effusion. No significant sinus disease. Other: None CT CERVICAL SPINE FINDINGS Alignment: Craniocervical junction aligned. Anatomic alignment of the cervical elements, with straightening of the normal cervical lordosis, likely positional. No subluxation. Skull base and vertebrae: No acute fracture at the skullbase. Vertebral body heights relatively maintained. No acute fracture identified. Soft tissues and spinal canal: Unremarkable cervical soft tissues. Lymph nodes are present, though not enlarged. Disc levels: Disc space narrowing with early endplate changes. No significant canal narrowing or significant foraminal narrowing. Upper chest: Unremarkable appearance of the lung apices. Enlargement of the left thyroid. Other: No bony canal narrowing. IMPRESSION: Head CT: Negative for acute intracranial abnormality. Cervical CT: No acute fracture or malalignment of the cervical spine. Left thyroid goiter.  Endocrine evaluation may be warranted. Electronically Signed   By: Corrie Mckusick D.O.   On: 06/29/2018 20:17   Ct Angio Chest Pe W And/or Wo Contrast  Result Date: 06/29/2018 CLINICAL DATA:  57 year old female with a history of syncope. Left-sided chest pain EXAM: CT ANGIOGRAPHY CHEST CT ABDOMEN AND PELVIS WITH CONTRAST TECHNIQUE: Multidetector CT imaging of the chest was performed using the standard protocol during bolus administration of intravenous contrast. Multiplanar CT image reconstructions and MIPs were obtained to evaluate the vascular anatomy. Multidetector CT imaging of the abdomen and pelvis was performed using the standard protocol  during bolus administration of intravenous contrast. CONTRAST:  145mL ISOVUE-370 IOPAMIDOL (ISOVUE-370) INJECTION 76% COMPARISON:  None. FINDINGS: CTA CHEST FINDINGS Cardiovascular: Heart: No cardiomegaly. No pericardial fluid/thickening. No significant coronary calcifications. Aorta: Unremarkable course, caliber, contour of the thoracic aorta. No aneurysm or dissection flap. No periaortic fluid. Pulmonary arteries: No central, lobar, segmental, or proximal subsegmental filling defects. Mediastinum/Nodes: Rightward deviation of the trachea secondary to enlargement of the left thyroid. Greatest diameter of the left thyroid tissue measures approximately 4.4 cm on the axial images. Unremarkable course of the thoracic esophagus. No lymphadenopathy. Lungs/Pleura: Central airways are clear. No pleural effusion. No confluent airspace disease. No pneumothorax. Musculoskeletal: No acute displaced fracture. Degenerative changes of the thoracic spine. Review of the MIP images confirms the above findings. CT ABDOMEN and PELVIS FINDINGS Hepatobiliary: There are a few small hypodense lesions of liver parenchyma. The largest measures 17 mm in segment 6. Overall, features are most compatible with benign cystic lesions. Calcified cholelithiasis without associated inflammation. Pancreas: Unremarkable pancreas Spleen: Unremarkable spleen Adrenals/Urinary Tract: Unremarkable appearance of the adrenal glands. No evidence of hydronephrosis of the right or left kidney. No nephrolithiasis. Unremarkable course of the bilateral ureters. Unremarkable appearance of the urinary bladder. Stomach/Bowel: Surgical  changes of the stomach. Small bowel unremarkable with no abnormal distention. No transition point. Laxity of the ventral abdominal wall, with focal hernia on the patient's right lower abdominal wall containing small bowel loops. No transition point. There is a smaller hernia sac in the supraumbilical region containing transverse colon  with no transition point. Normal appendix. Colonic diverticula without associated inflammation. Vascular/Lymphatic: No significant atherosclerosis. Mesenteric, renal, bilateral lower extremity arteries are patent. No abdominal lymphadenopathy. No retroperitoneal or pelvic lymphadenopathy. Reproductive: Hysterectomy Other: Additional hernia of the left lower abdominal wall containing fat. Musculoskeletal: No displaced fracture. Degenerative changes of the thoracolumbar spine. Degenerative changes of the hips. Review of the MIP images confirms the above findings. IMPRESSION: No acute CT finding of the chest, abdomen, pelvis. Left thyroid goiter.  Endocrine evaluation may be considered. Cholelithiasis without evidence of acute cholecystitis. Low-density, nonenhancing lesions of the liver, most likely benign biliary cysts. Surgical changes of the stomach. There are 3 ventral wall hernias. Supraumbilical hernia contains transverse colon without obstruction. Right lower abdominal wall hernia contains small bowel loops, without obstruction. Left lower abdominal hernia contains mesenteric fat. Diverticular disease without evidence of acute diverticulitis. Electronically Signed   By: Corrie Mckusick D.O.   On: 06/29/2018 20:28   Ct Cervical Spine Wo Contrast  Result Date: 06/29/2018 CLINICAL DATA:  57 year old female with a history of syncope EXAM: CT HEAD WITHOUT CONTRAST CT CERVICAL SPINE WITHOUT CONTRAST TECHNIQUE: Multidetector CT imaging of the head and cervical spine was performed following the standard protocol without intravenous contrast. Multiplanar CT image reconstructions of the cervical spine were also generated. COMPARISON:  None. FINDINGS: CT HEAD FINDINGS Brain: No acute intracranial hemorrhage. No midline shift or mass effect. Gray-white differentiation maintained. Unremarkable appearance of the ventricular system. Vascular: Unremarkable. Skull: No acute fracture.  No aggressive bone lesion identified.  Sinuses/Orbits: Unremarkable appearance of the orbits. Mastoid air cells clear. No middle ear effusion. No significant sinus disease. Other: None CT CERVICAL SPINE FINDINGS Alignment: Craniocervical junction aligned. Anatomic alignment of the cervical elements, with straightening of the normal cervical lordosis, likely positional. No subluxation. Skull base and vertebrae: No acute fracture at the skullbase. Vertebral body heights relatively maintained. No acute fracture identified. Soft tissues and spinal canal: Unremarkable cervical soft tissues. Lymph nodes are present, though not enlarged. Disc levels: Disc space narrowing with early endplate changes. No significant canal narrowing or significant foraminal narrowing. Upper chest: Unremarkable appearance of the lung apices. Enlargement of the left thyroid. Other: No bony canal narrowing. IMPRESSION: Head CT: Negative for acute intracranial abnormality. Cervical CT: No acute fracture or malalignment of the cervical spine. Left thyroid goiter.  Endocrine evaluation may be warranted. Electronically Signed   By: Corrie Mckusick D.O.   On: 06/29/2018 20:17   Ct Abdomen Pelvis W Contrast  Result Date: 06/29/2018 CLINICAL DATA:  57 year old female with a history of syncope. Left-sided chest pain EXAM: CT ANGIOGRAPHY CHEST CT ABDOMEN AND PELVIS WITH CONTRAST TECHNIQUE: Multidetector CT imaging of the chest was performed using the standard protocol during bolus administration of intravenous contrast. Multiplanar CT image reconstructions and MIPs were obtained to evaluate the vascular anatomy. Multidetector CT imaging of the abdomen and pelvis was performed using the standard protocol during bolus administration of intravenous contrast. CONTRAST:  140mL ISOVUE-370 IOPAMIDOL (ISOVUE-370) INJECTION 76% COMPARISON:  None. FINDINGS: CTA CHEST FINDINGS Cardiovascular: Heart: No cardiomegaly. No pericardial fluid/thickening. No significant coronary calcifications. Aorta:  Unremarkable course, caliber, contour of the thoracic aorta. No aneurysm or dissection flap. No periaortic fluid.  Pulmonary arteries: No central, lobar, segmental, or proximal subsegmental filling defects. Mediastinum/Nodes: Rightward deviation of the trachea secondary to enlargement of the left thyroid. Greatest diameter of the left thyroid tissue measures approximately 4.4 cm on the axial images. Unremarkable course of the thoracic esophagus. No lymphadenopathy. Lungs/Pleura: Central airways are clear. No pleural effusion. No confluent airspace disease. No pneumothorax. Musculoskeletal: No acute displaced fracture. Degenerative changes of the thoracic spine. Review of the MIP images confirms the above findings. CT ABDOMEN and PELVIS FINDINGS Hepatobiliary: There are a few small hypodense lesions of liver parenchyma. The largest measures 17 mm in segment 6. Overall, features are most compatible with benign cystic lesions. Calcified cholelithiasis without associated inflammation. Pancreas: Unremarkable pancreas Spleen: Unremarkable spleen Adrenals/Urinary Tract: Unremarkable appearance of the adrenal glands. No evidence of hydronephrosis of the right or left kidney. No nephrolithiasis. Unremarkable course of the bilateral ureters. Unremarkable appearance of the urinary bladder. Stomach/Bowel: Surgical changes of the stomach. Small bowel unremarkable with no abnormal distention. No transition point. Laxity of the ventral abdominal wall, with focal hernia on the patient's right lower abdominal wall containing small bowel loops. No transition point. There is a smaller hernia sac in the supraumbilical region containing transverse colon with no transition point. Normal appendix. Colonic diverticula without associated inflammation. Vascular/Lymphatic: No significant atherosclerosis. Mesenteric, renal, bilateral lower extremity arteries are patent. No abdominal lymphadenopathy. No retroperitoneal or pelvic lymphadenopathy.  Reproductive: Hysterectomy Other: Additional hernia of the left lower abdominal wall containing fat. Musculoskeletal: No displaced fracture. Degenerative changes of the thoracolumbar spine. Degenerative changes of the hips. Review of the MIP images confirms the above findings. IMPRESSION: No acute CT finding of the chest, abdomen, pelvis. Left thyroid goiter.  Endocrine evaluation may be considered. Cholelithiasis without evidence of acute cholecystitis. Low-density, nonenhancing lesions of the liver, most likely benign biliary cysts. Surgical changes of the stomach. There are 3 ventral wall hernias. Supraumbilical hernia contains transverse colon without obstruction. Right lower abdominal wall hernia contains small bowel loops, without obstruction. Left lower abdominal hernia contains mesenteric fat. Diverticular disease without evidence of acute diverticulitis. Electronically Signed   By: Corrie Mckusick D.O.   On: 06/29/2018 20:28   Dg Hip Unilat With Pelvis 2-3 Views Left  Result Date: 06/30/2018 CLINICAL DATA:  Fall. EXAM: DG HIP (WITH OR WITHOUT PELVIS) 2-3V LEFT COMPARISON:  None. FINDINGS: Single view of the pelvis and two views of the LEFT hip are provided. Osseous alignment is normal. No fracture line or displaced fracture fragment seen. Soft tissues about the pelvis and LEFT hip are unremarkable. IMPRESSION: Negative. Electronically Signed   By: Franki Cabot M.D.   On: 06/30/2018 12:54    Microbiology: No results found for this or any previous visit (from the past 240 hour(s)).   Labs: Basic Metabolic Panel: Recent Labs  Lab 06/29/18 1802 06/30/18 0406 07/01/18 0349  NA 141 140 142  K 4.4 4.3 4.7  CL 106 104 109  CO2 25 26 22   GLUCOSE 153* 100* 94  BUN 15 11 17   CREATININE 0.69 0.57 0.70  CALCIUM 9.2 8.7* 8.4*   Liver Function Tests: Recent Labs  Lab 06/29/18 1802 06/30/18 0406  AST 21 15  ALT 15 15  ALKPHOS 67 56  BILITOT 0.7 0.7  PROT 6.5 5.9*  ALBUMIN 3.7 3.3*   No  results for input(s): LIPASE, AMYLASE in the last 168 hours. No results for input(s): AMMONIA in the last 168 hours. CBC: Recent Labs  Lab 06/29/18 1802 06/30/18 0406 07/01/18 2641  WBC 7.4 7.1 5.1  NEUTROABS 5.4  --   --   HGB 13.6 12.7 11.9*  HCT 43.4 40.4 38.7  MCV 91.2 90.8 93.0  PLT 215 215 176   Cardiac Enzymes: Recent Labs  Lab 06/29/18 2226 06/30/18 0406 06/30/18 0950  TROPONINI <0.03 <0.03 <0.03   BNP: BNP (last 3 results) No results for input(s): BNP in the last 8760 hours.  ProBNP (last 3 results) No results for input(s): PROBNP in the last 8760 hours.  CBG: No results for input(s): GLUCAP in the last 168 hours.     Signed:  Irine Seal MD.  [facility] 07/01/2018, 11:06 AM

## 2018-07-01 NOTE — Progress Notes (Signed)
Order placed for cardiac MRI, per Dr. Radford Pax, to rule out right ventricular dysplasia. Our office will call to schedule appt time.

## 2018-07-15 ENCOUNTER — Ambulatory Visit (HOSPITAL_COMMUNITY): Admission: RE | Admit: 2018-07-15 | Payer: BLUE CROSS/BLUE SHIELD | Source: Ambulatory Visit

## 2020-03-30 IMAGING — CT CT HEAD W/O CM
5 of 8 series · 17 of 47 positions shown, 18 images · non-contrast
Comparison: None.

CLINICAL DATA: 56-year-old female with a history of syncope

EXAM:
CT HEAD WITHOUT CONTRAST
CT CERVICAL SPINE WITHOUT CONTRAST
TECHNIQUE: Multidetector CT imaging of the head and cervical spine was
performed following the standard protocol without intravenous
contrast. Multiplanar CT image reconstructions of the cervical spine
were also generated.

[Series 4: head wo · axial · 0.42mm/px · z∈[-112,-52]mm · 2 of 36 slices shown, 3 images]
[im 12/36  brain]
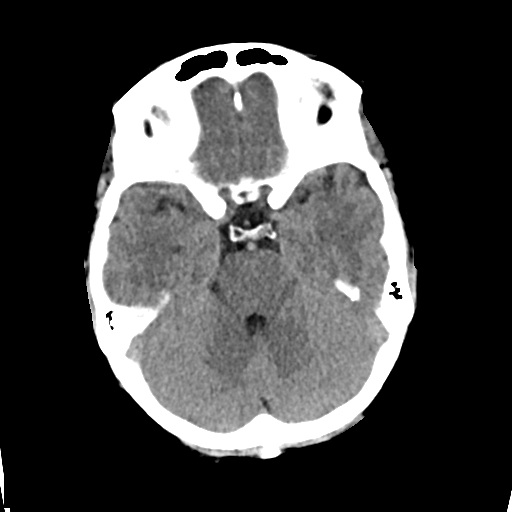
[im 12/36  bone]
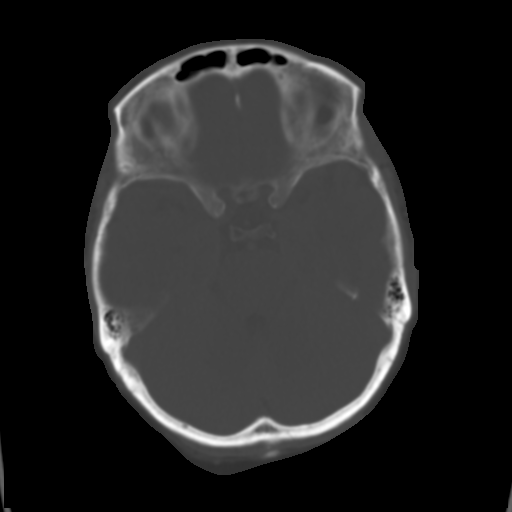
[im 24/36  brain]
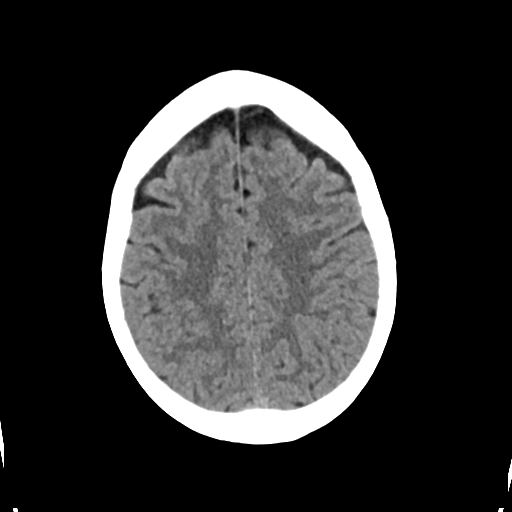

[Series 5: head bone · axial · 0.42mm/px · z∈[-146,-102]mm · 3 of 89 slices shown]
[im 12/89  bone]
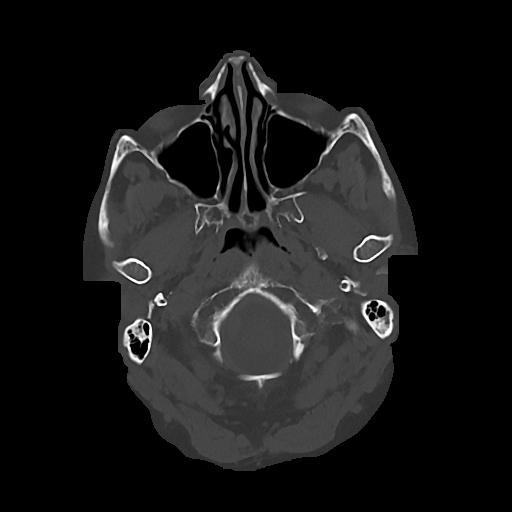
[im 23/89  bone]
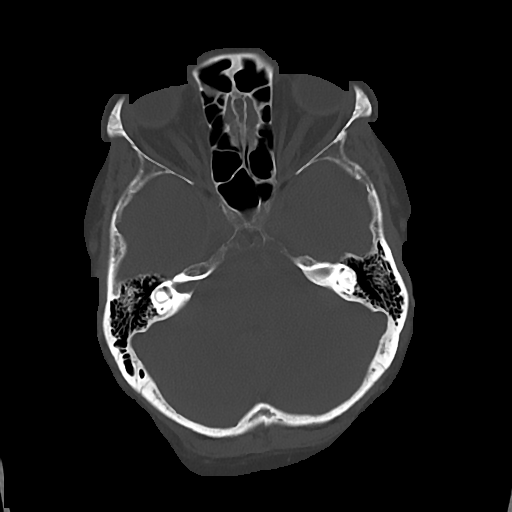
[im 34/89  bone]
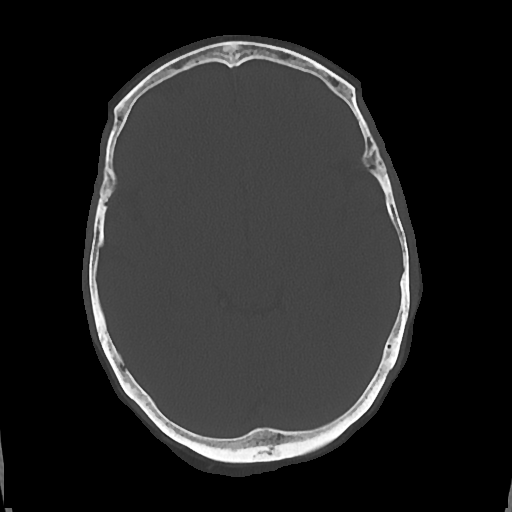

[Series 6: cor soft · coronal · 0.34mm/px · 3 of 67 slices shown]
[im 19/67  brain]
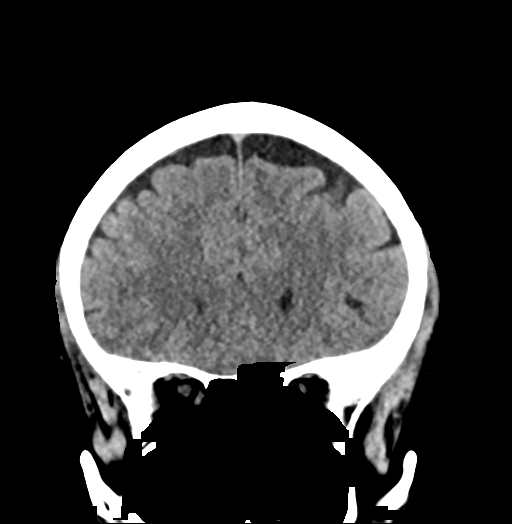
[im 29/67  brain]
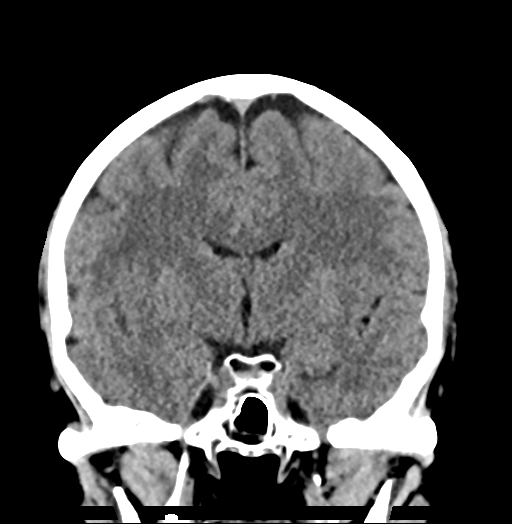
[im 38/67  brain]
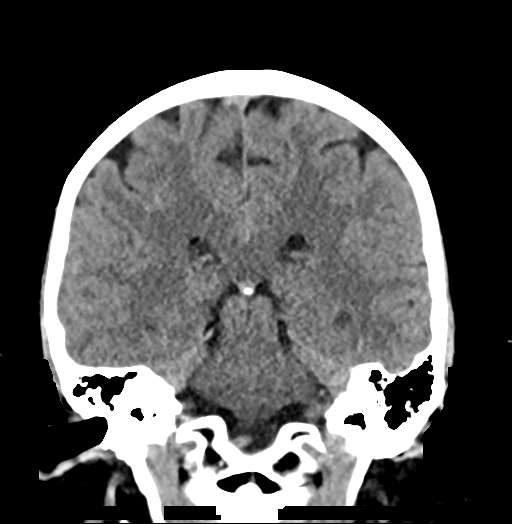

[Series 7: sag soft · sagittal · 0.34mm/px · 2 of 55 slices shown]
[im 19/55  brain]
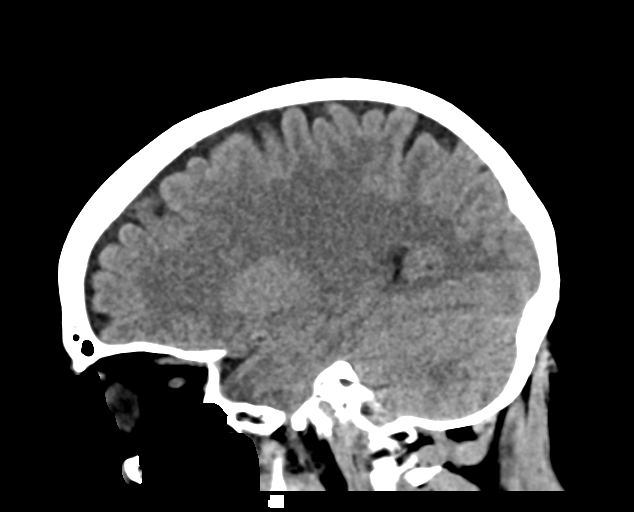
[im 37/55  brain]
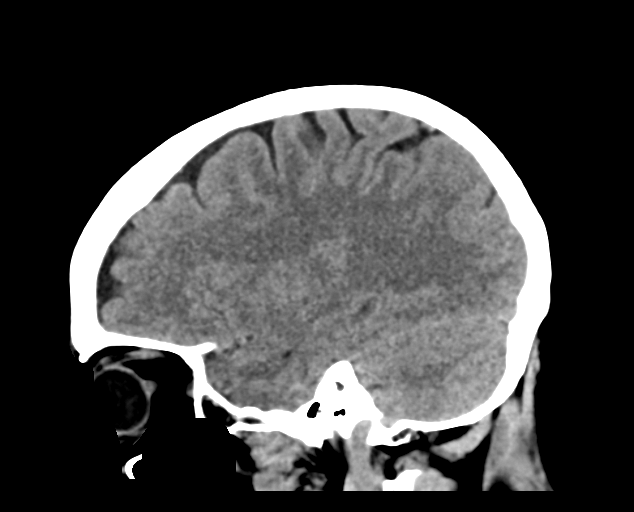

[Series 12: orthogonal axials · axial · 0.21mm/px · z∈[-279,-184]mm · 7 of 88 slices shown]
[im 11/88  brain]
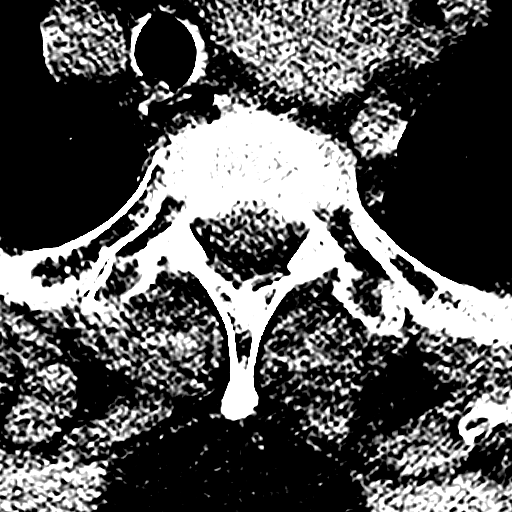
[im 22/88  brain]
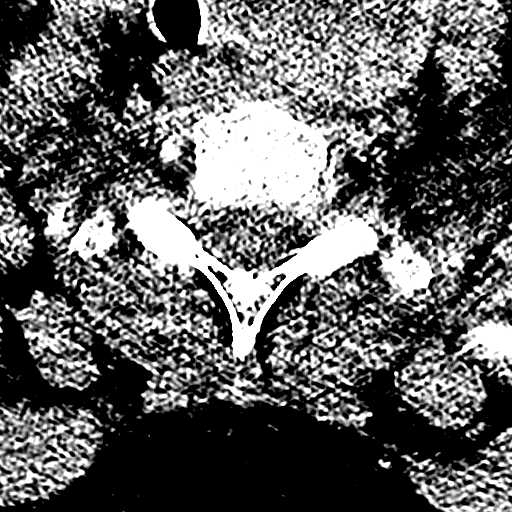
[im 33/88  brain]
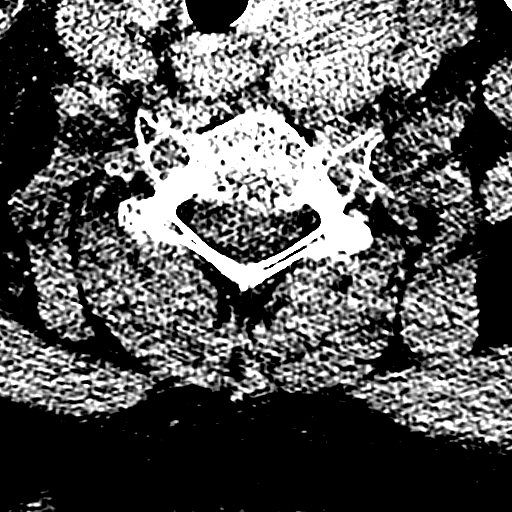
[im 44/88  brain]
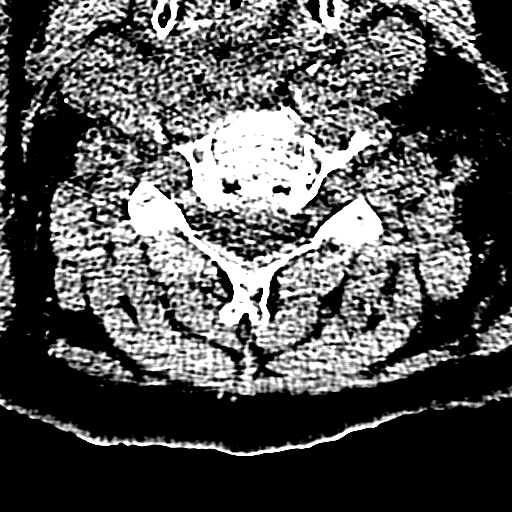
[im 55/88  brain]
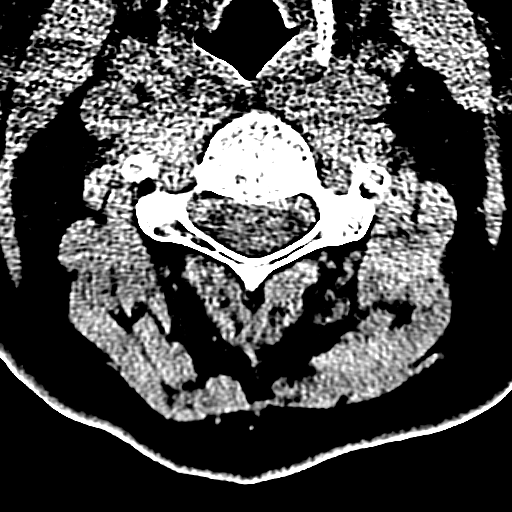
[im 66/88  brain]
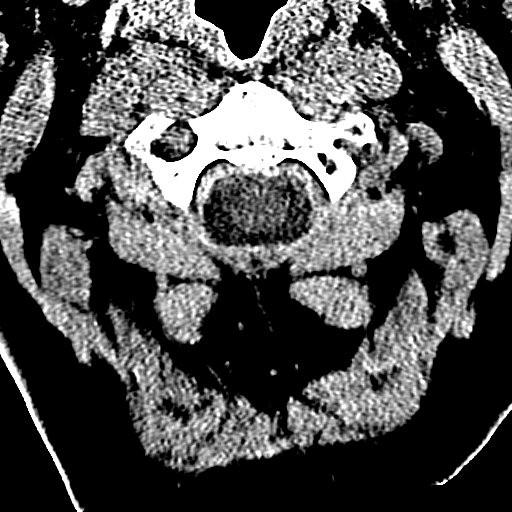
[im 77/88  brain]
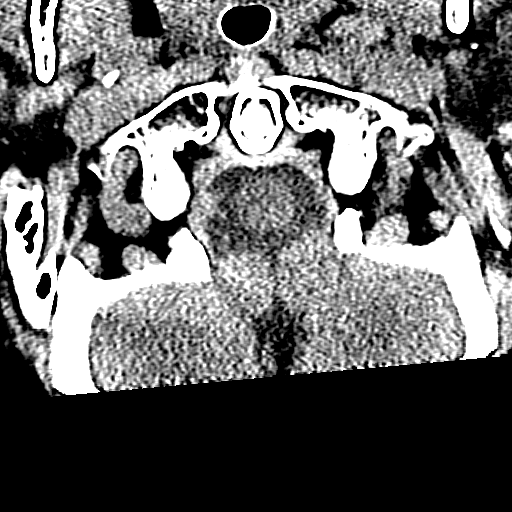

[17 of 47 positions shown; findings below may reference images not displayed]

FINDINGS: CT HEAD FINDINGS

Brain: No acute intracranial hemorrhage. No midline shift or mass
effect. Gray-white differentiation maintained. Unremarkable
appearance of the ventricular system.

Vascular: Unremarkable.

Skull: No acute fracture.  No aggressive bone lesion identified.

Sinuses/Orbits: Unremarkable appearance of the orbits. Mastoid air
cells clear. No middle ear effusion. No significant sinus disease.

Other: None

CT CERVICAL SPINE FINDINGS

Alignment: Craniocervical junction aligned. Anatomic alignment of
the cervical elements, with straightening of the normal cervical
lordosis, likely positional.. No subluxation.

Skull base and vertebrae: No acute fracture at the skullbase.
Vertebral body heights relatively maintained. No acute fracture
identified.

Soft tissues and spinal canal: Unremarkable cervical soft tissues.
Lymph nodes are present, though not enlarged.

Disc levels: Disc space narrowing with early endplate changes. No
significant canal narrowing or significant foraminal narrowing.

Upper chest: Unremarkable appearance of the lung apices. Enlargement
of the left thyroid.

Other: No bony canal narrowing.
IMPRESSION: Head CT:

Negative for acute intracranial abnormality.

Cervical CT:

No acute fracture or malalignment of the cervical spine.

Left thyroid goiter.  Endocrine evaluation may be warranted.

## 2020-03-31 IMAGING — DX DG HIP (WITH OR WITHOUT PELVIS) 2-3V*L*
3 series · 3 of 3 positions shown · non-contrast
Comparison: None.

CLINICAL DATA: Fall.

EXAM:
DG HIP (WITH OR WITHOUT PELVIS) 2-3V LEFT

[pelvis ap]
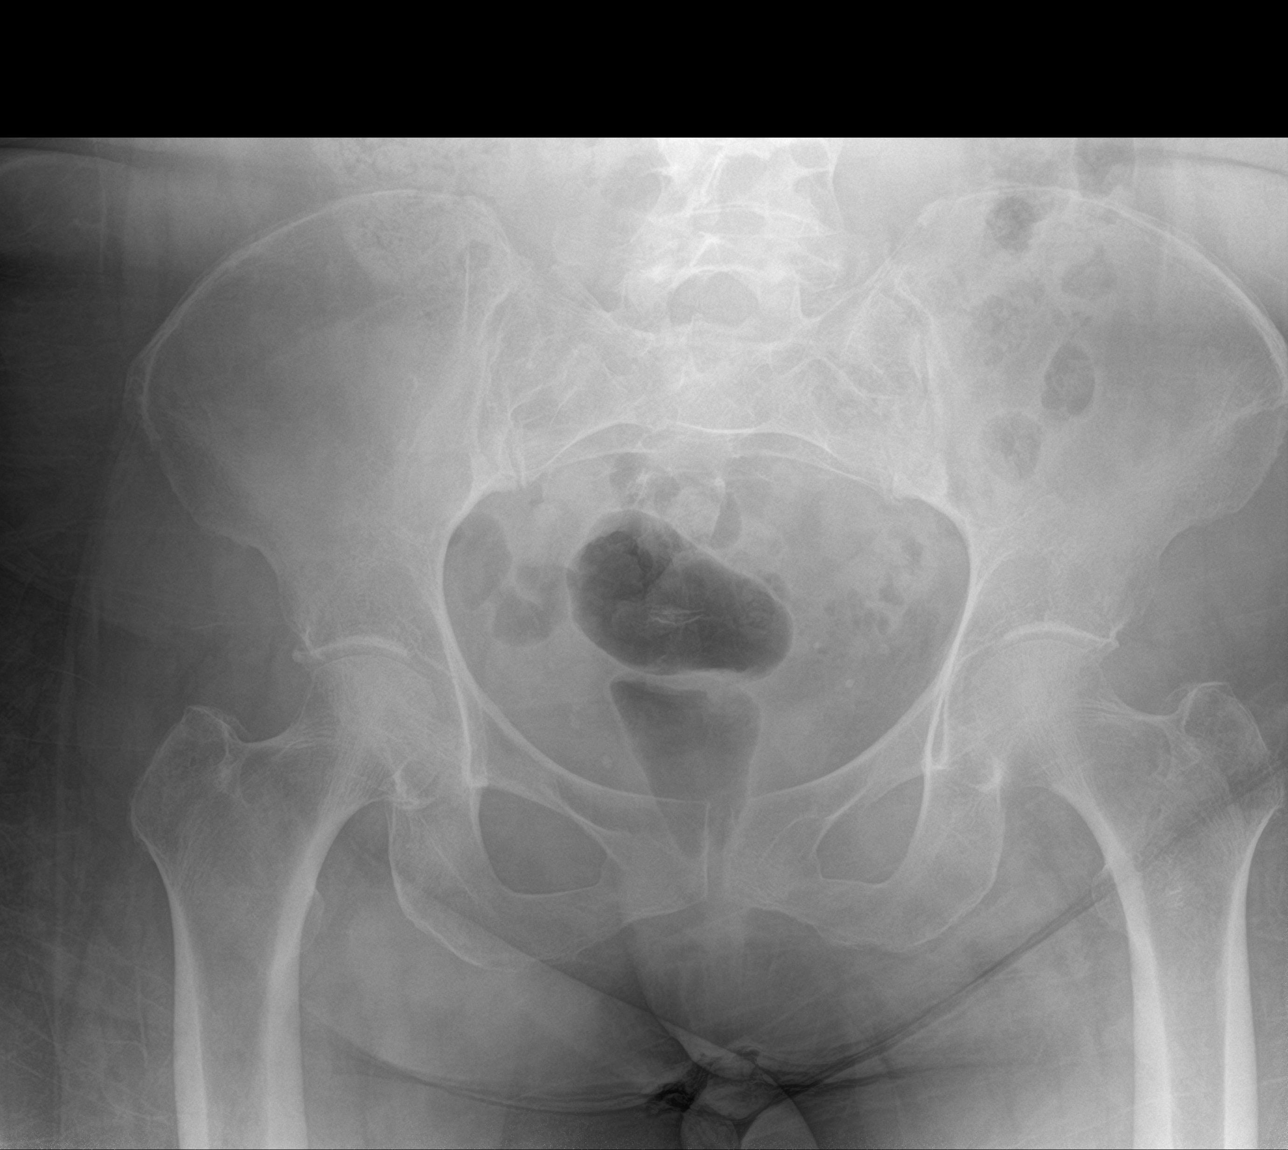

[hip ap]
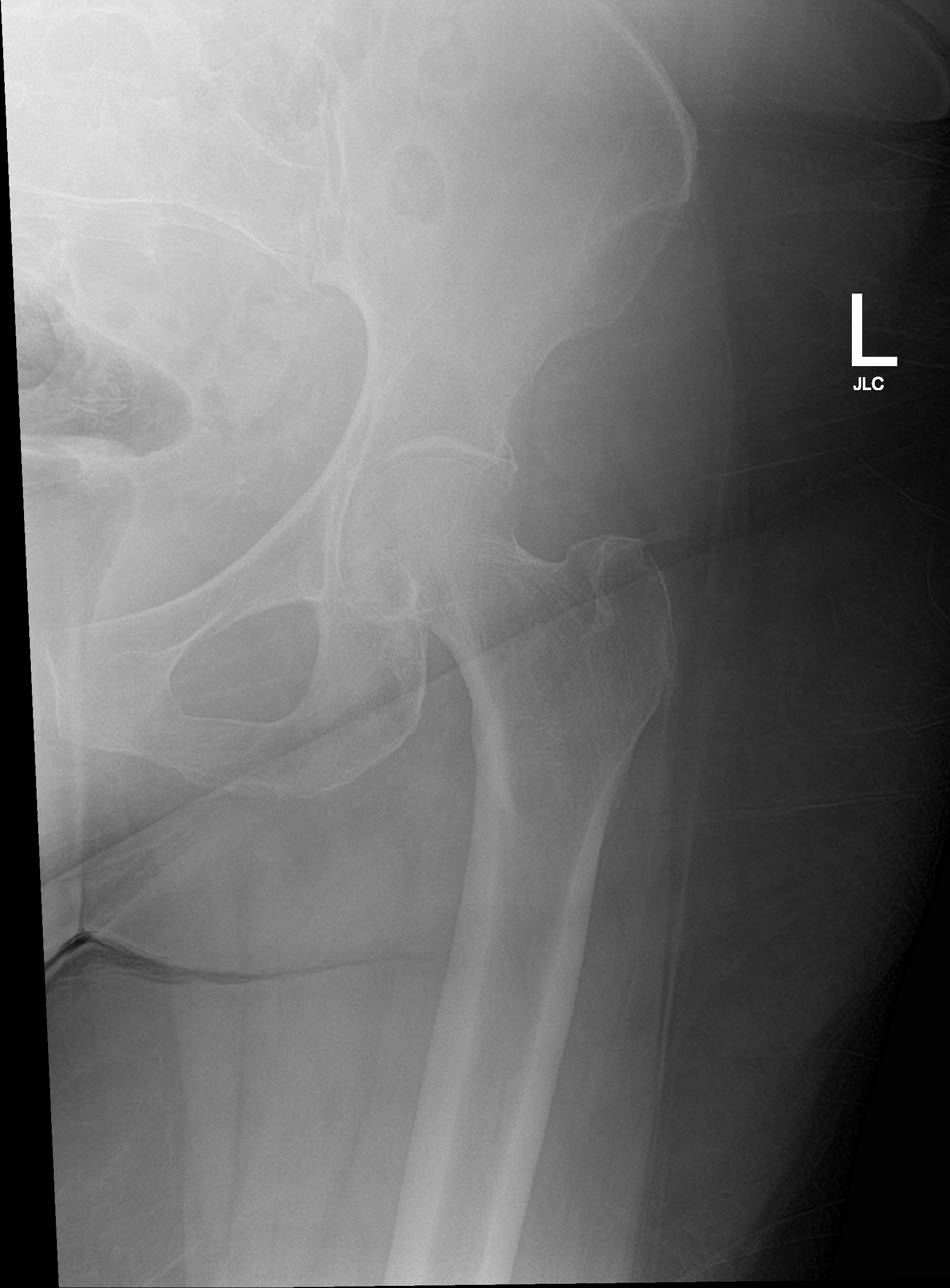

[hip lat]
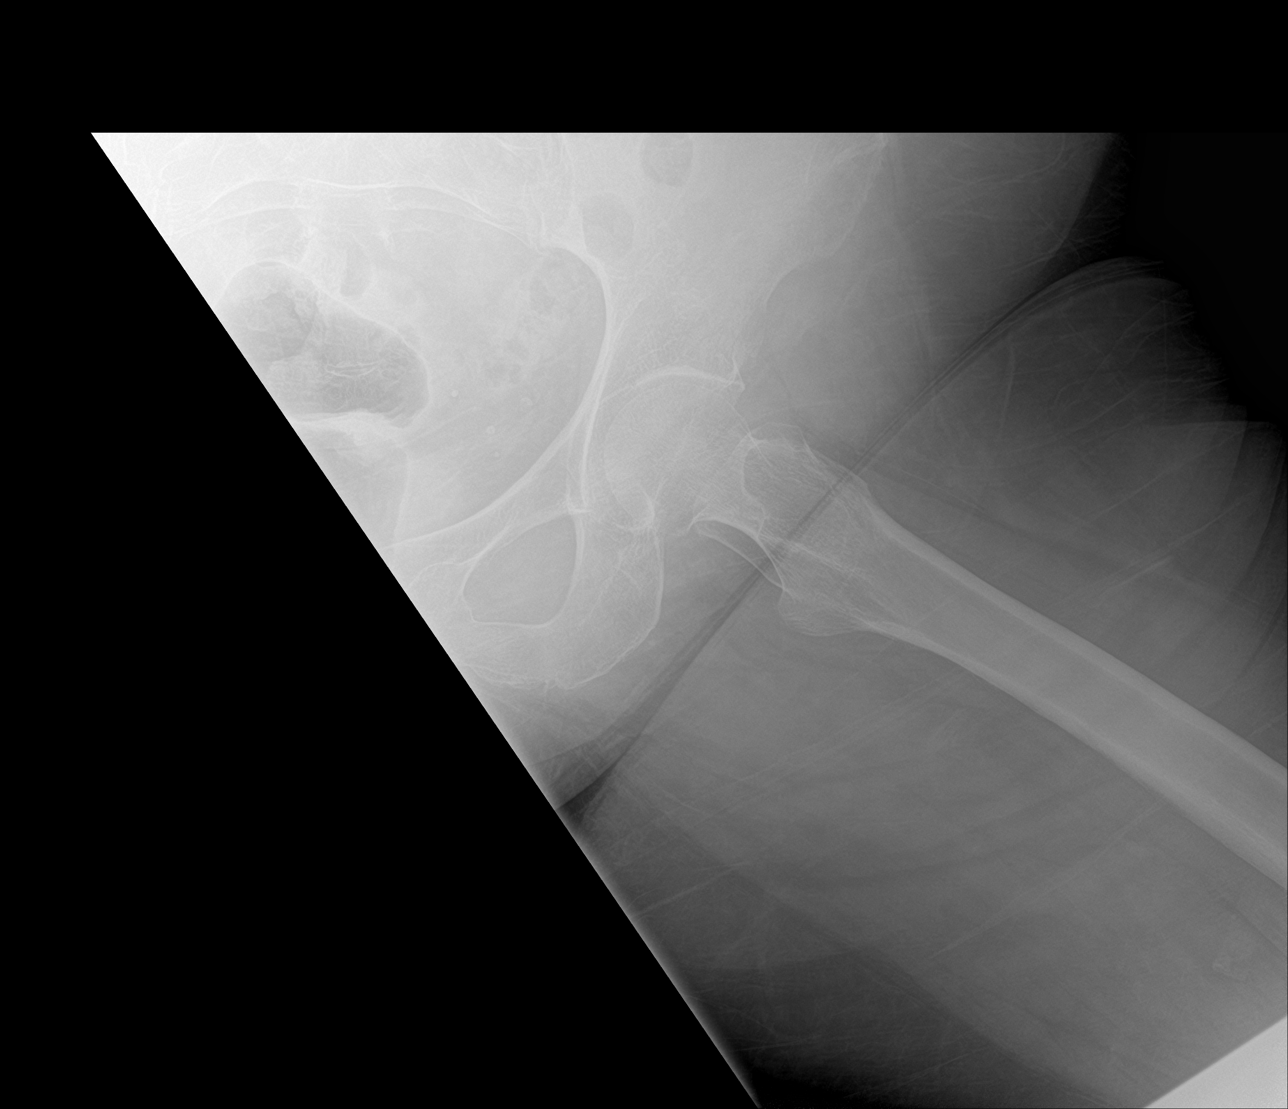

[3 of 3 positions shown; findings below may reference images not displayed]

FINDINGS: Single view of the pelvis and two views of the LEFT hip are
provided.

Osseous alignment is normal. No fracture line or displaced fracture
fragment seen. Soft tissues about the pelvis and LEFT hip are
unremarkable.
IMPRESSION: Negative.

## 2020-07-02 ENCOUNTER — Other Ambulatory Visit: Payer: Self-pay | Admitting: Hematology and Oncology

## 2020-07-02 DIAGNOSIS — S22000A Wedge compression fracture of unspecified thoracic vertebra, initial encounter for closed fracture: Secondary | ICD-10-CM

## 2020-07-04 ENCOUNTER — Other Ambulatory Visit: Payer: Self-pay

## 2020-07-04 ENCOUNTER — Ambulatory Visit
Admission: RE | Admit: 2020-07-04 | Discharge: 2020-07-04 | Disposition: A | Discharge status: Home / Self Care | Payer: BLUE CROSS/BLUE SHIELD | Source: Ambulatory Visit | Attending: Hematology and Oncology | Admitting: Hematology and Oncology

## 2020-07-04 ENCOUNTER — Other Ambulatory Visit: Payer: Self-pay | Admitting: Diagnostic Radiology

## 2020-07-04 DIAGNOSIS — S22000A Wedge compression fracture of unspecified thoracic vertebra, initial encounter for closed fracture: Secondary | ICD-10-CM

## 2020-07-04 HISTORY — PX: IR RADIOLOGIST EVAL & MGMT: IMG5224

## 2020-07-04 NOTE — Consult Note (Signed)
Chief Complaint: Patient was seen in consultation today for T11 kyphoplasty at the request of [name],[name]  Referring Physician(s): [name],[name]  History of Present Illness: Andrea Carrillo is a 59 y.o. female with history of breast cancer and status post bilateral mastectomies.  Patient has been having back pain since December 2020.  Patient believes she felt a pop when it occurred.  The back pain has persisted for months and gives her intermittent pain and discomfort.  She has difficulty getting up and out of a chair due to the discomfort.  She has had bilateral knees replaced in the interim.  She recently had thyroid surgery for an indeterminate thyroid nodule.  Past medical history is also significant for Crohn's disease, history of bariatric surgery and osteoporosis.  Patient is feeling good about her health except for the intermittent back pain.  She tries to avoid taking any pain medicine.  Radiographs of the thoracic and lumbar spine on 05/20/2020 demonstrated compression deformity involving the T11 vertebral body.  Subsequently, the patient had an MRI on 05/24/2020.  MRI demonstrated an acute to subacute mild T11 superior endplate compression fracture.  The compression fracture has benign characteristics.  Minimal retropulsion.  She denies leg pain or radiculopathy.  Past Medical History:  Diagnosis Date   Acid reflux    Arthritis    Asthma    Attention deficit    Hypothyroidism 06/30/2018   Osteoporosis     No past surgical history on file.  Allergies: Latex  Medications: Prior to Admission medications   Medication Sig Start Date End Date Taking? Authorizing Provider  acetaminophen-codeine (TYLENOL #3) 300-30 MG tablet Take 1-2 tablets by mouth every 4 (four) hours as needed for moderate pain. 07/01/18   Eugenie Filler, MD  letrozole [facility]) 2.5 MG tablet Take 2.5 mg by mouth every evening.  11/04/17   [provider]  levothyroxine (SYNTHROID, LEVOTHROID)  25 MCG tablet Take 25 mcg by mouth every evening.     [provider]  omeprazole (PRILOSEC) 20 MG capsule Take 20 mg by mouth every evening. 05/03/14   [provider]     Family History  Problem Relation Age of Onset   Thyroid cancer Mother    Hypertension Mother    Diabetes Other    CAD Neg Hx    Stroke Neg Hx     Social History   Socioeconomic History   Marital status: Married    Spouse name: Not on file   Number of children: Not on file   Years of education: Not on file   Highest education level: Not on file  Occupational History   Not on file  Tobacco Use   Smoking status: Never Smoker   Smokeless tobacco: Never Used  Substance and Sexual Activity   Alcohol use: No   Drug use: No   Sexual activity: Not on file  Other Topics Concern   Not on file  Social History Narrative   Not on file   Social Determinants of Health   Financial Resource Strain:    Difficulty of Paying Living Expenses:   Food Insecurity:    Worried About Charity fundraiser in the Last Year:    Arboriculturist in the Last Year:   Transportation Needs:    Film/video editor (Medical):    Lack of Transportation (Non-Medical):   Physical Activity:    Days of Exercise per Week:    Minutes of Exercise per Session:  Stress:    Feeling of Stress :   Social Connections:    Frequency of Communication with Friends and Family:    Frequency of Social Gatherings with Friends and Family:    Attends Religious Services:    Active Member of Clubs or Organizations:    Attends Archivist Meetings:    Marital Status:     Review of Systems  Musculoskeletal: Positive for back pain.   Physical Exam Constitutional:      General: She is not in acute distress. Cardiovascular:     Rate and Rhythm: Normal rate and regular rhythm.  Pulmonary:     Effort: Pulmonary effort is normal.     Breath sounds: Normal breath sounds.  Musculoskeletal:      Comments: Focal tenderness along the spinous processes in the lower thoracic spine and mid lumbar spine region.  Neurological:     General: No focal deficit present.     Mental Status: She is alert and oriented to person, place, and time.        Imaging: No results found.  Labs:  CBC: No results for input(s): WBC, HGB, HCT, PLT in the last 8760 hours.  COAGS: No results for input(s): INR, APTT in the last 8760 hours.  BMP: No results for input(s): NA, K, CL, CO2, GLUCOSE, BUN, CALCIUM, CREATININE, GFRNONAA, GFRAA in the last 8760 hours.  Invalid input(s): CMP  LIVER FUNCTION TESTS: No results for input(s): BILITOT, AST, ALT, ALKPHOS, PROT, ALBUMIN in the last 8760 hours.  TUMOR MARKERS: No results for input(s): AFPTM, CEA, CA199, CHROMGRNA in the last 8760 hours.  Assessment and Plan:  59 year old with a compression fracture involving the T11 superior endplate.  Although the patient believes the injury occurred in December 2020, she still has persistent pain and discomfort in this area.  MRI from 1 month ago confirms edema along the superior endplate.  In addition, the patient does have point tenderness in the lower thoracic spine that corresponds with the imaging abnormalities.  Findings are suggestive for a nonhealing compression fracture along the superior endplate of P10.  At this time, the patient feels that the back pain is lifestyle limiting and limiting her ability to exercise and perform desired activities.  Findings are suggestive for an osteoporotic fracture based on MRI findings and DEXA scan.  Patient is a candidate for T11 kyphoplasty based on the persistent pain and evidence for persistent edema along the superior endplate on recent MRI.  We discussed the T11 kyphoplasty in depth.  Patient understands that the procedure would be performed as an outpatient with moderate sedation.  Risks of the procedure including bleeding, infection and cement migration were  discussed.  Patient would like to proceed with the T11 kyphoplasty procedure.  We will plan to do the procedure at [facility] in the next 1 to 2 weeks.  We will try to schedule the procedure with me but patient is comfortable having one of my partners perform the procedure if there is a scheduling issue.  Thank you for this interesting consult.  I greatly enjoyed meeting Ruchel Vittitow and look forward to participating in their care.  A copy of this report was sent to the requesting provider on this date.  Electronically Signed: Burman Riis 07/04/2020, 3:47 PM   I spent a total of 15 minutes   in face to face in clinical consultation, greater than 50% of which was counseling/coordinating care for T11 compression fracture.
